# Patient Record
Sex: Male | Born: 1955
Health system: Southern US, Community
[De-identification: ages and names within clinical notes are randomized; demographics above are authoritative.]

## PROBLEM LIST (undated history)

## (undated) DIAGNOSIS — T8859XA Other complications of anesthesia, initial encounter: Secondary | ICD-10-CM

## (undated) DIAGNOSIS — M1990 Unspecified osteoarthritis, unspecified site: Secondary | ICD-10-CM

## (undated) DIAGNOSIS — G8929 Other chronic pain: Secondary | ICD-10-CM

## (undated) DIAGNOSIS — E7800 Pure hypercholesterolemia, unspecified: Secondary | ICD-10-CM

## (undated) DIAGNOSIS — D759 Disease of blood and blood-forming organs, unspecified: Secondary | ICD-10-CM

## (undated) DIAGNOSIS — C801 Malignant (primary) neoplasm, unspecified: Secondary | ICD-10-CM

## (undated) DIAGNOSIS — J45909 Unspecified asthma, uncomplicated: Secondary | ICD-10-CM

## (undated) DIAGNOSIS — I10 Essential (primary) hypertension: Secondary | ICD-10-CM

## (undated) HISTORY — PX: WRIST SURGERY: SHX841

## (undated) HISTORY — PX: BACK SURGERY: SHX140

## (undated) HISTORY — PX: OTHER SURGICAL HISTORY: SHX169

## (undated) HISTORY — PX: FOOT FRACTURE SURGERY: SHX645

## (undated) HISTORY — PX: FINGER SURGERY: SHX640

## (undated) HISTORY — PX: CHOLECYSTECTOMY: SHX55

---

## 1998-04-25 ENCOUNTER — Emergency Department (HOSPITAL_COMMUNITY): Admission: EM | Admit: 1998-04-25 | Discharge: 1998-04-25 | Payer: Self-pay | Admitting: *Deleted

## 1998-06-05 ENCOUNTER — Ambulatory Visit (HOSPITAL_COMMUNITY): Admission: RE | Admit: 1998-06-05 | Discharge: 1998-06-05 | Payer: Self-pay | Admitting: Family Medicine

## 1998-07-19 ENCOUNTER — Encounter: Payer: Self-pay | Admitting: Neurosurgery

## 1998-07-21 ENCOUNTER — Inpatient Hospital Stay (HOSPITAL_COMMUNITY): Admission: RE | Admit: 1998-07-21 | Discharge: 1998-07-23 | Payer: Self-pay | Admitting: Neurosurgery

## 1998-08-14 ENCOUNTER — Encounter: Admission: RE | Admit: 1998-08-14 | Discharge: 1998-11-12 | Payer: Self-pay | Admitting: Neurosurgery

## 1998-11-13 ENCOUNTER — Encounter: Admission: RE | Admit: 1998-11-13 | Discharge: 1999-02-11 | Payer: Self-pay

## 1999-02-12 ENCOUNTER — Encounter: Admission: RE | Admit: 1999-02-12 | Discharge: 1999-05-14 | Payer: Self-pay

## 1999-03-23 ENCOUNTER — Ambulatory Visit (HOSPITAL_COMMUNITY): Admission: RE | Admit: 1999-03-23 | Discharge: 1999-03-23 | Payer: Self-pay | Admitting: Neurosurgery

## 1999-03-23 ENCOUNTER — Encounter: Payer: Self-pay | Admitting: Neurosurgery

## 2000-05-05 ENCOUNTER — Observation Stay (HOSPITAL_COMMUNITY): Admission: EM | Admit: 2000-05-05 | Discharge: 2000-05-06 | Payer: Self-pay | Admitting: Emergency Medicine

## 2000-05-05 ENCOUNTER — Encounter: Payer: Self-pay | Admitting: Emergency Medicine

## 2000-05-06 ENCOUNTER — Encounter: Payer: Self-pay | Admitting: Cardiology

## 2000-06-04 ENCOUNTER — Encounter: Payer: Self-pay | Admitting: Family Medicine

## 2000-06-04 ENCOUNTER — Ambulatory Visit (HOSPITAL_COMMUNITY): Admission: RE | Admit: 2000-06-04 | Discharge: 2000-06-04 | Payer: Self-pay | Admitting: Family Medicine

## 2000-06-10 ENCOUNTER — Ambulatory Visit (HOSPITAL_COMMUNITY): Admission: RE | Admit: 2000-06-10 | Discharge: 2000-06-10 | Payer: Self-pay | Admitting: Family Medicine

## 2000-06-10 ENCOUNTER — Encounter: Payer: Self-pay | Admitting: Family Medicine

## 2000-07-23 ENCOUNTER — Ambulatory Visit (HOSPITAL_COMMUNITY): Admission: RE | Admit: 2000-07-23 | Discharge: 2000-07-23 | Payer: Self-pay | Admitting: Gastroenterology

## 2000-07-23 ENCOUNTER — Encounter (INDEPENDENT_AMBULATORY_CARE_PROVIDER_SITE_OTHER): Payer: Self-pay | Admitting: Specialist

## 2000-08-20 ENCOUNTER — Encounter (HOSPITAL_COMMUNITY): Admission: RE | Admit: 2000-08-20 | Discharge: 2000-11-18 | Payer: Self-pay | Admitting: Gastroenterology

## 2000-09-19 ENCOUNTER — Observation Stay (HOSPITAL_COMMUNITY): Admission: RE | Admit: 2000-09-19 | Discharge: 2000-09-20 | Payer: Self-pay | Admitting: General Surgery

## 2000-09-19 ENCOUNTER — Encounter (INDEPENDENT_AMBULATORY_CARE_PROVIDER_SITE_OTHER): Payer: Self-pay | Admitting: Specialist

## 2000-12-03 ENCOUNTER — Encounter (HOSPITAL_COMMUNITY): Admission: RE | Admit: 2000-12-03 | Discharge: 2001-03-03 | Payer: Self-pay | Admitting: Gastroenterology

## 2001-04-20 ENCOUNTER — Encounter (HOSPITAL_COMMUNITY): Admission: RE | Admit: 2001-04-20 | Discharge: 2001-06-17 | Payer: Self-pay | Admitting: Gastroenterology

## 2001-06-19 ENCOUNTER — Encounter (HOSPITAL_COMMUNITY): Admission: RE | Admit: 2001-06-19 | Discharge: 2001-09-17 | Payer: Self-pay | Admitting: Gastroenterology

## 2002-02-04 ENCOUNTER — Encounter (HOSPITAL_COMMUNITY): Admission: RE | Admit: 2002-02-04 | Discharge: 2002-05-05 | Payer: Self-pay | Admitting: Gastroenterology

## 2002-05-07 ENCOUNTER — Encounter (HOSPITAL_COMMUNITY): Admission: RE | Admit: 2002-05-07 | Discharge: 2002-08-05 | Payer: Self-pay | Admitting: Gastroenterology

## 2002-07-05 ENCOUNTER — Encounter: Payer: Self-pay | Admitting: Family Medicine

## 2002-07-05 ENCOUNTER — Ambulatory Visit (HOSPITAL_COMMUNITY): Admission: RE | Admit: 2002-07-05 | Discharge: 2002-07-05 | Payer: Self-pay | Admitting: Family Medicine

## 2002-10-22 ENCOUNTER — Encounter (HOSPITAL_COMMUNITY): Admission: RE | Admit: 2002-10-22 | Discharge: 2003-01-20 | Payer: Self-pay | Admitting: Gastroenterology

## 2003-02-03 ENCOUNTER — Encounter (HOSPITAL_COMMUNITY): Admission: RE | Admit: 2003-02-03 | Discharge: 2003-05-04 | Payer: Self-pay | Admitting: Gastroenterology

## 2003-07-28 ENCOUNTER — Encounter (HOSPITAL_COMMUNITY): Admission: RE | Admit: 2003-07-28 | Discharge: 2003-10-26 | Payer: Self-pay | Admitting: Gastroenterology

## 2003-08-02 ENCOUNTER — Ambulatory Visit (HOSPITAL_COMMUNITY): Admission: RE | Admit: 2003-08-02 | Discharge: 2003-08-02 | Payer: Self-pay | Admitting: Family Medicine

## 2003-08-02 ENCOUNTER — Encounter: Payer: Self-pay | Admitting: Family Medicine

## 2003-12-07 ENCOUNTER — Ambulatory Visit (HOSPITAL_COMMUNITY): Admission: RE | Admit: 2003-12-07 | Discharge: 2003-12-07 | Payer: Self-pay | Admitting: Gastroenterology

## 2004-01-11 ENCOUNTER — Encounter (HOSPITAL_COMMUNITY): Admission: RE | Admit: 2004-01-11 | Discharge: 2004-04-10 | Payer: Self-pay | Admitting: Gastroenterology

## 2004-01-23 ENCOUNTER — Ambulatory Visit (HOSPITAL_COMMUNITY): Admission: RE | Admit: 2004-01-23 | Discharge: 2004-01-23 | Payer: Self-pay | Admitting: Family Medicine

## 2004-03-08 ENCOUNTER — Ambulatory Visit (HOSPITAL_BASED_OUTPATIENT_CLINIC_OR_DEPARTMENT_OTHER): Admission: RE | Admit: 2004-03-08 | Discharge: 2004-03-08 | Payer: Self-pay | Admitting: Orthopedic Surgery

## 2004-03-08 ENCOUNTER — Ambulatory Visit (HOSPITAL_COMMUNITY): Admission: RE | Admit: 2004-03-08 | Discharge: 2004-03-08 | Payer: Self-pay | Admitting: Orthopedic Surgery

## 2004-04-04 ENCOUNTER — Encounter (HOSPITAL_COMMUNITY): Admission: RE | Admit: 2004-04-04 | Discharge: 2004-06-25 | Payer: Self-pay | Admitting: *Deleted

## 2004-09-21 ENCOUNTER — Encounter (HOSPITAL_COMMUNITY): Admission: RE | Admit: 2004-09-21 | Discharge: 2004-12-20 | Payer: Self-pay | Admitting: Gastroenterology

## 2004-10-30 ENCOUNTER — Encounter: Admission: RE | Admit: 2004-10-30 | Discharge: 2004-10-30 | Payer: Self-pay | Admitting: Family Medicine

## 2004-11-18 HISTORY — PX: HEMORROIDECTOMY: SUR656

## 2004-12-07 ENCOUNTER — Emergency Department (HOSPITAL_COMMUNITY): Admission: EM | Admit: 2004-12-07 | Discharge: 2004-12-07 | Payer: Self-pay | Admitting: Emergency Medicine

## 2005-01-03 ENCOUNTER — Emergency Department (HOSPITAL_COMMUNITY): Admission: EM | Admit: 2005-01-03 | Discharge: 2005-01-03 | Payer: Self-pay | Admitting: Emergency Medicine

## 2005-01-28 ENCOUNTER — Ambulatory Visit (HOSPITAL_COMMUNITY): Admission: RE | Admit: 2005-01-28 | Discharge: 2005-01-28 | Payer: Self-pay | Admitting: Urology

## 2005-04-08 ENCOUNTER — Encounter: Admission: RE | Admit: 2005-04-08 | Discharge: 2005-04-08 | Payer: Self-pay | Admitting: Gastroenterology

## 2005-09-30 ENCOUNTER — Encounter: Admission: RE | Admit: 2005-09-30 | Discharge: 2005-09-30 | Payer: Self-pay | Admitting: Gastroenterology

## 2007-02-21 ENCOUNTER — Encounter: Admission: RE | Admit: 2007-02-21 | Discharge: 2007-02-21 | Payer: Self-pay | Admitting: Gastroenterology

## 2008-03-29 ENCOUNTER — Encounter (HOSPITAL_COMMUNITY): Admission: RE | Admit: 2008-03-29 | Discharge: 2008-06-27 | Payer: Self-pay | Admitting: Gastroenterology

## 2008-07-06 ENCOUNTER — Encounter (HOSPITAL_COMMUNITY): Admission: RE | Admit: 2008-07-06 | Discharge: 2008-08-11 | Payer: Self-pay | Admitting: Gastroenterology

## 2008-07-11 ENCOUNTER — Ambulatory Visit (HOSPITAL_COMMUNITY): Admission: RE | Admit: 2008-07-11 | Discharge: 2008-07-11 | Payer: Self-pay | Admitting: Family Medicine

## 2008-07-13 ENCOUNTER — Ambulatory Visit (HOSPITAL_COMMUNITY): Admission: RE | Admit: 2008-07-13 | Discharge: 2008-07-13 | Payer: Self-pay | Admitting: Family Medicine

## 2008-07-14 ENCOUNTER — Ambulatory Visit (HOSPITAL_COMMUNITY): Admission: RE | Admit: 2008-07-14 | Discharge: 2008-07-14 | Payer: Self-pay | Admitting: Family Medicine

## 2008-07-14 ENCOUNTER — Ambulatory Visit: Payer: Self-pay | Admitting: Vascular Surgery

## 2008-07-14 ENCOUNTER — Encounter (INDEPENDENT_AMBULATORY_CARE_PROVIDER_SITE_OTHER): Payer: Self-pay | Admitting: Family Medicine

## 2009-02-22 ENCOUNTER — Encounter (HOSPITAL_COMMUNITY): Admission: RE | Admit: 2009-02-22 | Discharge: 2009-05-23 | Payer: Self-pay | Admitting: Gastroenterology

## 2010-03-24 ENCOUNTER — Inpatient Hospital Stay (HOSPITAL_COMMUNITY)
Admission: EM | Admit: 2010-03-24 | Discharge: 2010-03-26 | Payer: Self-pay | Source: Home / Self Care | Admitting: Emergency Medicine

## 2011-02-05 LAB — DIFFERENTIAL
Basophils Absolute: 0.1 10*3/uL (ref 0.0–0.1)
Basophils Relative: 1 % (ref 0–1)
Eosinophils Absolute: 0 10*3/uL (ref 0.0–0.7)
Eosinophils Relative: 0 % (ref 0–5)
Lymphocytes Relative: 9 % — ABNORMAL LOW (ref 12–46)
Lymphs Abs: 1.1 10*3/uL (ref 0.7–4.0)
Monocytes Absolute: 0.5 10*3/uL (ref 0.1–1.0)
Monocytes Relative: 4 % (ref 3–12)
Neutro Abs: 10.5 10*3/uL — ABNORMAL HIGH (ref 1.7–7.7)
Neutrophils Relative %: 86 % — ABNORMAL HIGH (ref 43–77)

## 2011-02-05 LAB — CBC
HCT: 43.2 % (ref 39.0–52.0)
Hemoglobin: 14.8 g/dL (ref 13.0–17.0)
MCHC: 34.3 g/dL (ref 30.0–36.0)
MCV: 88.5 fL (ref 78.0–100.0)
Platelets: 212 10*3/uL (ref 150–400)
RBC: 4.88 MIL/uL (ref 4.22–5.81)
RDW: 14.6 % (ref 11.5–15.5)
WBC: 12.3 10*3/uL — ABNORMAL HIGH (ref 4.0–10.5)

## 2011-02-05 LAB — BASIC METABOLIC PANEL
BUN: 11 mg/dL (ref 6–23)
CO2: 25 mEq/L (ref 19–32)
Calcium: 9.4 mg/dL (ref 8.4–10.5)
Chloride: 104 mEq/L (ref 96–112)
Creatinine, Ser: 1.05 mg/dL (ref 0.4–1.5)
GFR calc Af Amer: >60 mL/min (ref >60)
GFR calc non Af Amer: >60 mL/min (ref >60)
Glucose, Bld: 100 mg/dL — ABNORMAL HIGH (ref 70–99)
Potassium: 4 mEq/L (ref 3.5–5.1)
Sodium: 137 mEq/L (ref 135–145)

## 2011-02-27 LAB — POCT HEMOGLOBIN-HEMACUE: Hemoglobin: 15.6 g/dL (ref 13.0–17.0)

## 2011-04-05 NOTE — Procedures (Signed)
[facility]  Patient:    Stuart Gaines, Stuart Gaines                     MRN: [id number] Proc. Date: 07/23/00 Adm. Date:  [id number] Attending:  Louie Bun CC:         Elana Alm Eliezer Lofts., M.D.  Lorne Skeens. Hoxworth, M.D.   Procedure Report  PROCEDURE:  Colonoscopy.  SURGEON:  John C. Madilyn Fireman, M.D.  INDICATIONS FOR PROCEDURE:  Intermittent rectal bleeding in a 55 year old patient who is scheduled for upcoming cholecystectomy and surgery for asymptomatic internal hemorrhoid.  DESCRIPTION OF PROCEDURE:  The patient was placed in the left lateral decubitus position and placed on the pulse monitor with continuous low flow oxygen delivered by nasal cannula.  He was sedated with 75 mg of IV Demerol and 10 mg of IV Versed.  The Olympus video colonoscope was inserted into the rectum and advanced to the cecum, confirmed by transillumination at McBurneys point and visualization of the ileocecal valve and appendiceal orifice.  The prep was good.  Within the base of the cecum, there was seen a diminutive 5 mm polyp which was fulgurated by hot biopsy.  The remainder of the cecum, ascending, transverse, and descending colon appeared normal.  No further polyps, masses, diverticuli, or other mucosal abnormalities.  Within the sigmoid colon was seen an 8 mm sessile polyp which was fulgurated by hot biopsy and the remainder of the sigmoid colon was normal.  The proximal rectum appeared normal.  Retroflexed view of the anus did reveal somewhat enlarged internal hemorrhoids.  The colonoscope was then withdrawn and the patient returned to the recovery room in stable condition.  He tolerated the procedure well and there were no immediate complications.  IMPRESSION: 1. Enlarged internal hemorrhoid. 2. Two small colon polyps.  PLAN:  Await histology for determination of method and interval for future colon screening.  Followup with Dr. Johna Sheriff for cholecystectomy and  possible hemorrhoidectomy. DD:  07/23/00 TD:  07/24/00 Job: [id number]

## 2011-04-05 NOTE — Op Note (Signed)
NAME:  Stuart Gaines, Stuart Gaines                        ACCOUNT NO.:  [id number]   MEDICAL RECORD NO.:  [id number]                   PATIENT TYPE:  AMB   LOCATION:  ENDO                                 FACILITY:  [facility]   PHYSICIAN:  John C. Madilyn Fireman, M.D.                 DATE OF BIRTH:  [birth year]   DATE OF PROCEDURE:  12/06/2002  DATE OF DISCHARGE:                                 OPERATIVE REPORT   PROCEDURE:  Colonoscopy.   INDICATIONS FOR PROCEDURE:  History of adenomatous colon polyps 3 years ago.   DESCRIPTION OF PROCEDURE:  The patient was placed in the left lateral  decubitus position and placed on the pulse monitor with continuous low-flow  oxygen delivered by nasal cannula.  He was sedated with 100 mcg IV fentanyl  and 10 mg IV Versed.  The Olympus video colonoscope was inserted into the  rectum and advanced to the cecum, confirmed by transillumination at  McBurney's point and visualization of the ileocecal valve and appendiceal  orifice.  The prep was excellent.  The cecum, ascending, transverse, and  descending colon all appeared normal with no masses, polyps, diverticula, or  other mucosal abnormalities.  Within the sigmoid colon, there were seen a  few scattered diverticula and no other abnormalities.  The rectum appeared  normal, and retroflexed view of the anus revealed no obvious internal  hemorrhoids.  The scope was then withdrawn, and the patient returned to the  recovery room in stable condition.  He tolerated the procedure well, and  there were no immediate complications.   IMPRESSION:  1. Left-sided diverticulosis.  2. Otherwise, normal study.   PLAN:  Repeat colonoscopy in 5 years.                                               John C. Madilyn Fireman, M.D.    JCH/MEDQ  D:  12/07/2003  T:  12/07/2003  Job:  [id number]   cc:   Molly Maduro A. Nicholos Johns, M.D.  [address]  [place]  [place] [address]  Fax: [phone]

## 2011-04-05 NOTE — Op Note (Signed)
NAME:  Stuart, Gaines                        ACCOUNT NO.:  [id number]   MEDICAL RECORD NO.:  [id number]                   PATIENT TYPE:  AMB   LOCATION:  DSC                                  FACILITY:  [facility]   PHYSICIAN:  Nadara Mustard, M.D.                DATE OF BIRTH:  Mar 24, 1956   DATE OF PROCEDURE:  03/08/2004  DATE OF DISCHARGE:                                 OPERATIVE REPORT   PREOPERATIVE DIAGNOSIS:  Medial meniscal tear, left knee.   POSTOPERATIVE DIAGNOSIS:  1. Medial meniscal tear, left knee.  2. Osteochondral defect medial femoral condyle, left knee.  3. Foreign body, left knee, pencil lead, subcutaneous.   PROCEDURE:  1. Left knee arthroscopy with abrasion chondroplasty medial femoral condyle.  2. Partial medial meniscectomy.  3. Removal of pencil lead, subcutaneous, left knee.   SURGEON:  Nadara Mustard, M.D.   ANESTHESIA:  LMA.   ESTIMATED BLOOD LOSS:  Minimal.   ANTIBIOTICS:  None.   DRAINS:  None.   COMPLICATIONS:  None.   INJECTION:  20 mL of 0.5% Marcaine with 4 mg morphine.   DISPOSITION:  To the PACU in stable condition.   INDICATIONS FOR PROCEDURE:  The patient is a 55 year old gentleman with  mechanical symptoms in his left knee.  The patient has failed conservative  care and presents at this time for arthroscopic intervention.  MRI scan  confirms the meniscal pathology.  The risks and benefits were discussed  including infection, neurovascular injury, persistent pain, need for  additional surgery.  The patient states he understands and wishes to proceed  at this time.   DESCRIPTION OF PROCEDURE:  The patient was brought to OR room 5 and  underwent a general LMA anesthetic.  After an adequate level of anesthesia  was obtained, the patient's left lower extremity was prepped using DuraPrep  and draped in a sterile field.  The scope was inserted through the  inferolateral portal and a working portal was established inferomedial.  There was a  significant amount of synovitis anteriorly and this was removed.  The patient had a degenerative tear of the medial meniscus and this was  debrided with a shaver.  The patient had a large osteochondral defect of the  medial femoral condyle and this was debrided back to bleeding, viable  subchondral bone and the loose edges were debrided with a ring curet.  Examination of the notch showed an intact ACL.  Visualization of the lateral  joint line in the figure-of-four position showed na intact lateral meniscus  and intact lateral cartilage.  Both the remaining medial meniscus and entire  lateral meniscus were probed and the meniscus were stable.  Visualization of  the patellofemoral joint showed there to be no loose bodies.  There was a  small amount of degenerative changes.  Visualization of the medial and  lateral joint line and re-exanimation of the compartment showed there to  be  no further loose bodies.  The instruments were removed. The portals were  closed using 4-0 nylon.  The joint was injected with a total of 20 mL of  0.5% Marcaine plain and 4 mg morphine.  Attention was then focused to an  area just inferior to the lateral portal where the patient had a chronic  piece of lead stuck under the skin.  A horizontal incision was made directly  over the area of the pencil lead.  The area of skin and soft tissue were  ellipsed out at the area of the pencil lead foreign body.  This was then  removed in one block of tissue.  The wound was then evaluated and cleansed.  There was no evidence of any further foreign bodies.  This was then sutured  closed with 4-0 nylon.  The wounds were covered with Adaptic, orthopedic  sponges, sterile Webril, and a Coban dressing.  The patient was extubated  and taken to the PACU in stable condition.  Plan to follow up in two weeks,  weight-bearing as tolerated, change the dressing in three days.                                               Nadara Mustard,  M.D.    MVD/MEDQ  D:  03/08/2004  T:  03/09/2004  Job:  [id number]

## 2011-04-05 NOTE — Op Note (Signed)
[facility]  Patient:    Stuart Gaines, Stuart Gaines                     MRN: [id number] Proc. Date: 09/19/00 Adm. Date:  [id number] Attending:  Glenna Fellows Tappan                           Operative Report  PREOPERATIVE DIAGNOSES: 1. Cholelithiasis. 2. Hemorrhoids with anal fissure.  PROCEDURES: 1. Laparoscopic cholecystectomy. 2. Hemorrhoidectomy with banding of internal hemorrhoids and internal    anal sphincterotomy.  SURGEON:  Lorne Skeens. Hoxworth, M.D.  ANESTHESIA:  General.  BRIEF HISTORY:  Stuart Gaines is a 55 year old white male who presents with repeated episodes of right upper quadrant abdominal pain.  He also has longstanding bleeding, irritation and pain from hemorrhoids.  He has had an extensive workup, including gallbladder ultrasound showing stones, a normal GI series except for small hiatal hernia.  He has not responded to proton pump inhibitors.  He has had a full colonoscopy which was negative except for hemorrhoids.  Examination has revealed right upper quadrant tenderness, and in addition he is difficult to examine in the office due to discomfort.  Has an apparent posterior fissure and also hemorrhoids.  We have elected to proceed with laparoscopic cholecystectomy followed by hemorrhoidectomy and internal anal sphincterotomy.  The nature of the procedures, indications and risks of bleeding, infection and other complications were discussed and understood.  He is now brought to the operating room for these procedures.  DESCRIPTION OF PROCEDURE:  The patient is brought to the operating room and placed in supine position on the operating table, and general endotracheal anesthesia was induced.  He had received broad spectrum antibiotics preoperatively.  The abdomen was sterilely prepped and draped.  Local anesthesia was used to infiltrate trocar sites prior to the incisions.  A 1 cm incision was made in the umbilicus and then  carried down to the midline fascia.  It was sharply incised for 1 cm, the peritoneum entered under direct vision.  Through a mattress suture of 0 Vicryl, the Hasson trocar was placed and pneumoperitoneum established.  Under direct vision a 10 mm trocar was placed in the psoas ______ area, and two 5 mm trocars were placed in the right upper quadrant.  The right anterior abdominal wall was carefully examined as the patient felt that he had symptoms for hernia in this area; but there was no evidence of any defect in the abdominal wall.  There were some omental adhesions of the gallbladder that appeared somewhat chronically thickened.  The fundus was grasped, and elevated up over the liver.  Omental adhesions were taken down with cautery.  The infundibulum was exposed and retracted inferolaterally.  There was a lot of fatty tissue around the neck of the gallbladder, which was stripped down toward the port of hepatis.  Anterior branch of the cystic artery was divided between clips.  The distal gallbladder was thoroughly dissected and the cystic duct gallbladder junction identified. This was dissected 360 degrees and the cystic duct dissected over about 1 cm. The anatomy was cleared and the cystic duct was totally clipped proximally, clipped distally and divided.  The gallbladder was dissected free from its bed using hook and spatula cautery.  The posterior branch of cystic artery was controlled with clips.  The gallbladder was detached from the liver, brought out through the umbilicus.  Hemostasis was obtained in the gallbladder bed with  cautery.  The right upper quadrant was thoroughly irrigated and suctioned; good hemostasis was assured.  Trocars were removed under direct vision.  All CO2 was evacuated from the peritoneal cavity.  The pursestring suture was secured at the umbilicus.  The skin incisions were closed with interrupted subcuticular 4-0 Monocryl and Steri-Strips.  Dry sterile  dressing was applied.  Sponge, needle and instrument counts were correct.  Following this the patient was carefully positioned in the lithotomy position, and the perineum prepped and draped.  The anus was carefully dilated and a Seneca retractor placed and the anal canal carefully inspected.  There was a large, deep posterior midline fissure and associated skin tag.  In the left lateral position was a large group of combination internal and external hemorrhoids.  There were fairly extensive internal hemorrhoids in the right anterior and right ______ posterior locations, but minimal external hemorrhoids in this location.  I elected to perform a formal hemorrhoidectomy in the left lateral combination hemorrhoid area, with anal sphincterotomy and band the remainder of the internal hemorrhoids.  The hemorrhoid group in the left lateral area was grasped with Pennington clamp, and a 3-0 chromic suture placed in its apex.  The hemorrhoid group was then elliptically excised out onto the anoderm.  The hemorrhoid group was carefully dissected up off the sphincter muscles, which were identified and protected.  The hemorrhoid group was removed.  Hemostasis was obtained with electrocautery and with the chromic suture which was used to close the incision in a running locking fashion. Following this, in the right lateral position a small stab wound was made outside the dentate line, and a Hemostat inserted into the intersphincteric groove (which was easily palpable), and the hypertrophied internal anal sphincter brought out into the small incision and divided with cautery. Hemostasis was obtained with cautery and pressure.  Following this, four good-sized internal hemorrhoids in the anterior, right anterior and right posterior areas were exposed, grasped and rubber-banded.  The anus was inspected, hemostasis was complete.  The perianal areas were injected with 0.25% Marcaine with epinephrine.  Dressing  of Xylocaine jelly 4 x 4s were applied.  The patient was taken to recovery in good condition. DD:  09/19/00 TD:  09/20/00 Job: [id number]

## 2011-08-14 LAB — POCT I-STAT 4, (NA,K, GLUC, HGB,HCT)
Glucose, Bld: 83
HCT: 45
Hemoglobin: 15.3
Operator id: 238621
Potassium: 4.6
Sodium: 140

## 2013-04-29 ENCOUNTER — Other Ambulatory Visit: Payer: Self-pay | Admitting: Neurological Surgery

## 2013-04-29 DIAGNOSIS — M549 Dorsalgia, unspecified: Secondary | ICD-10-CM

## 2013-05-10 ENCOUNTER — Ambulatory Visit
Admission: RE | Admit: 2013-05-10 | Discharge: 2013-05-10 | Disposition: A | Discharge status: Home / Self Care | Payer: 59 | Source: Ambulatory Visit | Attending: Neurological Surgery | Admitting: Neurological Surgery

## 2013-05-10 DIAGNOSIS — M549 Dorsalgia, unspecified: Secondary | ICD-10-CM

## 2013-05-10 MED ORDER — GADOBENATE DIMEGLUMINE 529 MG/ML IV SOLN
20.0000 mL | Freq: Once | INTRAVENOUS | Status: AC | PRN
  Administered 2013-05-10: 20 mL via INTRAVENOUS

## 2013-11-18 HISTORY — PX: FRACTURE SURGERY: SHX138

## 2014-04-19 ENCOUNTER — Encounter (HOSPITAL_COMMUNITY): Payer: Self-pay | Admitting: Emergency Medicine

## 2014-04-19 ENCOUNTER — Emergency Department (HOSPITAL_COMMUNITY): Payer: 59

## 2014-04-19 ENCOUNTER — Emergency Department (HOSPITAL_COMMUNITY)
Admission: EM | Admit: 2014-04-19 | Discharge: 2014-04-19 | Disposition: A | Discharge status: Home / Self Care | Payer: 59 | Attending: Emergency Medicine | Admitting: Emergency Medicine

## 2014-04-19 DIAGNOSIS — I10 Essential (primary) hypertension: Secondary | ICD-10-CM | POA: Insufficient documentation

## 2014-04-19 DIAGNOSIS — L02818 Cutaneous abscess of other sites: Secondary | ICD-10-CM | POA: Insufficient documentation

## 2014-04-19 DIAGNOSIS — S0100XA Unspecified open wound of scalp, initial encounter: Secondary | ICD-10-CM | POA: Insufficient documentation

## 2014-04-19 DIAGNOSIS — E7800 Pure hypercholesterolemia, unspecified: Secondary | ICD-10-CM | POA: Insufficient documentation

## 2014-04-19 DIAGNOSIS — Y929 Unspecified place or not applicable: Secondary | ICD-10-CM | POA: Insufficient documentation

## 2014-04-19 DIAGNOSIS — Y9389 Activity, other specified: Secondary | ICD-10-CM | POA: Insufficient documentation

## 2014-04-19 DIAGNOSIS — L03818 Cellulitis of other sites: Secondary | ICD-10-CM | POA: Insufficient documentation

## 2014-04-19 DIAGNOSIS — IMO0002 Reserved for concepts with insufficient information to code with codable children: Secondary | ICD-10-CM | POA: Insufficient documentation

## 2014-04-19 DIAGNOSIS — L03811 Cellulitis of head [any part, except face]: Secondary | ICD-10-CM

## 2014-04-19 DIAGNOSIS — Z79899 Other long term (current) drug therapy: Secondary | ICD-10-CM | POA: Insufficient documentation

## 2014-04-19 HISTORY — DX: Essential (primary) hypertension: I10

## 2014-04-19 HISTORY — DX: Pure hypercholesterolemia, unspecified: E78.00

## 2014-04-19 MED ORDER — IBUPROFEN 800 MG PO TABS: 800.0000 mg | ORAL_TABLET | Freq: Three times a day (TID) | ORAL | Status: DC | PRN

## 2014-04-19 MED ORDER — CEPHALEXIN 500 MG PO CAPS: 1000.0000 mg | ORAL_CAPSULE | Freq: Two times a day (BID) | ORAL | Status: DC

## 2014-04-19 NOTE — ED Notes (Signed)
Per pt sts about 2 weeks ago he hit head on metal beam under a trailer. sts that since he has been having head pain and some swelling. sts think infection. sts he feels something moving around right eye.

## 2014-04-19 NOTE — ED Provider Notes (Signed)
CSN: [id number]     Arrival date & time 04/19/14  0905 History   First MD Initiated Contact with Patient 04/19/14 [phone]     Chief Complaint  Patient presents with  . Head Injury     (Consider location/radiation/quality/duration/timing/severity/associated sxs/prior Treatment) HPI Patient presents to the emergency department with headache and scalp.  Pain, following a head injury that occurred 2 weeks ago.  The patient, states, on his head underneath a trailer, that he was working on he states, that he didn't 2-3 times patient's main concern is that he had an infection of the scalp and is there any parasites could be under his skin.  Patient, states, that he has had no nausea, vomiting weakness blurred vision, chest pain, shortness of breath, fever, dizziness, numbness, rash or syncope.  Patient, states, that he's had persistent, headache since that time and the initial injury.  He should states, that he did not sustain.  No significant blows to the head, he hit his head several times.  Patient does have 2 small punctures in the top of the scalp he should states she feels a twitching around his right eye and forehead Past Medical History  Diagnosis Date  . Hypertension   . High cholesterol    Past Surgical History  Procedure Laterality Date  . Back surgery    . Cholecystectomy     History reviewed. No pertinent family history. History  Substance Use Topics  . Smoking status: Never Smoker   . Smokeless tobacco: Not on file  . Alcohol Use: No    Review of Systems  All other systems negative except as documented in the HPI. All pertinent positives and negatives as reviewed in the HPI.  Allergies  Review of patient's allergies indicates no known allergies.  Home Medications   Prior to Admission medications   Medication Sig Start Date End Date Taking? Authorizing Provider  lisinopril (PRINIVIL,ZESTRIL) 20 MG tablet Take 20 mg by mouth daily.   Yes Historical Provider, MD  omeprazole  (PRILOSEC) 20 MG capsule Take 20 mg by mouth daily.   Yes Historical Provider, MD  atorvastatin (LIPITOR) 80 MG tablet Take 40 mg by mouth daily. 03/24/14   Historical Provider, MD  diclofenac (VOLTAREN) 75 MG EC tablet Take 75 mg by mouth daily. 03/28/14   Historical Provider, MD  HYDROcodone-acetaminophen (NORCO) 7.5-325 MG per tablet Take 1 tablet by mouth 2 (two) times daily as needed. 03/26/14   Historical Provider, MD   BP 114/75  Pulse 91  Temp(Src) 97.4 F (36.3 C) (Oral)  Resp 14  Wt 225 lb (102.059 kg)  SpO2 96% Physical Exam  Nursing note and vitals reviewed. Constitutional: He is oriented to person, place, and time. He appears well-developed and well-nourished. No distress.  HENT:  Head: Normocephalic.    Mouth/Throat: Oropharynx is clear and moist.  Eyes: Pupils are equal, round, and reactive to light.  Neck: Normal range of motion. Neck supple.  Cardiovascular: Normal rate, regular rhythm and normal heart sounds.  Exam reveals no gallop and no friction rub.   No murmur heard. Pulmonary/Chest: Effort normal and breath sounds normal. No respiratory distress.  Neurological: He is alert and oriented to person, place, and time.  Skin: Skin is warm and dry. No rash noted. No erythema.    ED Course  Procedures (including critical care time) Labs Review Labs Reviewed - No data to display  Imaging Review Ct Head Wo Contrast  04/19/2014   CLINICAL DATA:  Recent traumatic injury with  headaches  EXAM: CT HEAD WITHOUT CONTRAST  TECHNIQUE: Contiguous axial images were obtained from the base of the skull through the vertex without intravenous contrast.  COMPARISON:  None.  FINDINGS: The bony calvarium is intact. The ventricles are of normal size and configuration. No findings to suggest acute hemorrhage, acute infarction or space-occupying mass lesion are noted.  IMPRESSION: No acute abnormality noted.   Electronically Signed   By: Inez Catalina M.D.   On: 04/19/2014 10:09    Patient  be treated for possible cellulitis of the scalp.  Told to return here as needed.  Advised to followup with his primary care Dr.. told to keep area clean and dry  Brent General, PA-C 04/19/14 1038

## 2014-04-19 NOTE — ED Notes (Signed)
States Sunday afternoon started having his right eyebrow twitching. States this morning it from his nose up to forehead. Does have small spot of swelling to the top of his head in bald spot.

## 2014-04-19 NOTE — Discharge Instructions (Signed)
Keep the area clean and dry.  Followup with her primary care Dr. return here for any worsening in your condition.

## 2014-04-19 NOTE — ED Notes (Signed)
Gerald Stabs, PA at the bedside.

## 2014-04-19 NOTE — ED Notes (Signed)
Pt brought back to room; pt getting undressed and into a gown at this time; Angie Fava, RN aware

## 2014-04-19 NOTE — ED Provider Notes (Signed)
Medical screening examination/treatment/procedure(s) were performed by non-physician practitioner and as supervising physician I was immediately available for consultation/collaboration.   EKG Interpretation None       Orlie Dakin, MD 04/19/14 [id number]

## 2015-05-19 ENCOUNTER — Ambulatory Visit (INDEPENDENT_AMBULATORY_CARE_PROVIDER_SITE_OTHER): Payer: 59

## 2015-05-19 ENCOUNTER — Encounter: Payer: Self-pay | Admitting: Podiatry

## 2015-05-19 ENCOUNTER — Ambulatory Visit (INDEPENDENT_AMBULATORY_CARE_PROVIDER_SITE_OTHER): Payer: 59 | Admitting: Podiatry

## 2015-05-19 VITALS — BP 118/67 | HR 91 | Resp 15

## 2015-05-19 DIAGNOSIS — G5762 Lesion of plantar nerve, left lower limb: Secondary | ICD-10-CM

## 2015-05-19 DIAGNOSIS — M79672 Pain in left foot: Secondary | ICD-10-CM | POA: Diagnosis not present

## 2015-05-19 DIAGNOSIS — G5782 Other specified mononeuropathies of left lower limb: Secondary | ICD-10-CM

## 2015-05-19 NOTE — Progress Notes (Signed)
   Subjective:    Patient ID: Stuart Gaines, male    DOB: 02/21/1956, 59 y.o.   MRN: [id number]  HPI  Pt presents with left foot pain/popping sensation on the plantar region between 4th and 5th toe, radiates dorsally, lasting 8 months now  Review of Systems  Musculoskeletal: Positive for myalgias.  All other systems reviewed and are negative.      Objective:   Physical Exam        Assessment & Plan:

## 2015-05-21 NOTE — Progress Notes (Signed)
Subjective:     Patient ID: Stuart Gaines, male   DOB: 1956/06/03, 59 y.o.   MRN: [id number]  HPI patient presents stating he's been having some pain in his forefoot for the last 8 months and he does not remember specific injury. Feels like a popping sensation and the pain has gradually gotten worse over that time   Review of Systems  All other systems reviewed and are negative.      Objective:   Physical Exam  Constitutional: He is oriented to person, place, and time.  Cardiovascular: Intact distal pulses.   Musculoskeletal: Normal range of motion.  Neurological: He is oriented to person, place, and time.  Skin: Skin is warm.  Nursing note and vitals reviewed.  neurovascular status intact with muscle strength adequate and range of motion subtalar midtarsal joint within normal limits. I noted mild swelling in the midfoot region left but it does not appear to be tender and he does not remember specific injury and this area and there didn't seem to be quite a bit of discomfort when I palpated the third interspace left especially when pressing the foot together and pressing in to the interspace itself area patient has good digital perfusion and is well oriented 3     Assessment:     Possibility for neuroma with a Lisfranc's injury noted on x-ray which could be contributory to his problem    Plan:     H&P and x-rays reviewed and today I went ahead and treated as a pinched nerve utilizing a neuro lysis injection consisting of purified D hydrated alcohol solution along with Marcaine and we'll reevaluate again in 2 weeks

## 2015-06-02 ENCOUNTER — Ambulatory Visit (INDEPENDENT_AMBULATORY_CARE_PROVIDER_SITE_OTHER): Payer: 59 | Admitting: Podiatry

## 2015-06-02 ENCOUNTER — Encounter: Payer: Self-pay | Admitting: Podiatry

## 2015-06-02 VITALS — BP 117/73 | HR 93 | Resp 15

## 2015-06-02 DIAGNOSIS — G5782 Other specified mononeuropathies of left lower limb: Secondary | ICD-10-CM

## 2015-06-02 DIAGNOSIS — M779 Enthesopathy, unspecified: Secondary | ICD-10-CM

## 2015-06-02 DIAGNOSIS — G5762 Lesion of plantar nerve, left lower limb: Secondary | ICD-10-CM

## 2015-06-02 MED ORDER — TRIAMCINOLONE ACETONIDE 10 MG/ML IJ SUSP
10.0000 mg | Freq: Once | INTRAMUSCULAR | Status: AC
  Administered 2015-06-02: 10 mg

## 2015-06-02 NOTE — Progress Notes (Signed)
Subjective:     Patient ID: Stuart Gaines, male   DOB: 1956/06/14, 59 y.o.   MRN: [id number]  HPI patient states it seems to reduce my pain for about 4 hours but then I had complete reoccurrence and I still feel a popping sensation after being on my foot for around an hour and then pain which occurs subsequent   Review of Systems     Objective:   Physical Exam Neurovascular status intact with patient having discomfort in the fourth metatarsophalangeal joint with what feels like a prominent metatarsal along with moderate discomfort in the third intermetatarsal space with no apparent neuroma-like sensation when I pressed into the interspace    Assessment:     Very difficult to determine between an inflammatory capsulitis with plantarflexed metatarsal versus possible neuroma symptomatology    Plan:     H&P and condition reviewed and at this time I did a proximal nerve block aspirated the fourth MPJ getting out of a small amount of clear fluid and injected with a quarter cc of dexamethasone Kenalog and applied thick plantar pad. We may make this patient orthotics or ultimately this patient may require surgery for MRI

## 2015-06-23 ENCOUNTER — Ambulatory Visit (INDEPENDENT_AMBULATORY_CARE_PROVIDER_SITE_OTHER): Payer: 59 | Admitting: Podiatry

## 2015-06-23 ENCOUNTER — Encounter: Payer: Self-pay | Admitting: Podiatry

## 2015-06-23 VITALS — BP 119/70 | HR 71 | Resp 15

## 2015-06-23 DIAGNOSIS — Q667 Congenital pes cavus: Secondary | ICD-10-CM | POA: Diagnosis not present

## 2015-06-23 DIAGNOSIS — M779 Enthesopathy, unspecified: Secondary | ICD-10-CM

## 2015-06-23 DIAGNOSIS — M216X9 Other acquired deformities of unspecified foot: Secondary | ICD-10-CM

## 2015-06-24 NOTE — Progress Notes (Signed)
Subjective:     Patient ID: Stuart Gaines, male   DOB: Dec 23, 1955, 59 y.o.   MRN: [id number]  HPI patient states I can still feel the bones crunching but it seems that I got a lot of relief when you did that injection into my joint   Review of Systems     Objective:   Physical Exam Neurovascular status intact muscle strength adequate with pain in the fourth MPJ left that has improved but it is still present when palpated with quite a bit of prominence of the fourth metatarsal itself and lifting of the toe    Assessment:     Probable inflammatory condition fourth MPJ left with possibility still for moderate neuroma-like symptomatology    Plan:     H&P and x-rays reviewed with patient and at this time her to try a customized orthotics to disperse weight off the joint with consideration long-term that this may require surgical intervention with digital stabilization possible elevating osteotomy and possible neuroma excision

## 2015-07-14 ENCOUNTER — Ambulatory Visit: Payer: 59 | Admitting: *Deleted

## 2015-07-14 DIAGNOSIS — M779 Enthesopathy, unspecified: Secondary | ICD-10-CM

## 2015-07-14 NOTE — Patient Instructions (Signed)

## 2015-07-14 NOTE — Progress Notes (Signed)
Patient ID: Stuart Gaines, male   DOB: Oct 23, 1956, 59 y.o.   MRN: [id number] Patient presents for orthotic pick up.  Verbal and written break in and wear instructions given.  Patient will follow up in 4 weeks if symptoms worsen or fail to improve.

## 2017-01-21 DIAGNOSIS — Z125 Encounter for screening for malignant neoplasm of prostate: Secondary | ICD-10-CM | POA: Diagnosis not present

## 2017-01-21 DIAGNOSIS — K219 Gastro-esophageal reflux disease without esophagitis: Secondary | ICD-10-CM | POA: Diagnosis not present

## 2017-01-21 DIAGNOSIS — I10 Essential (primary) hypertension: Secondary | ICD-10-CM | POA: Diagnosis not present

## 2017-01-21 DIAGNOSIS — Z0000 Encounter for general adult medical examination without abnormal findings: Secondary | ICD-10-CM | POA: Diagnosis not present

## 2017-01-21 DIAGNOSIS — E7800 Pure hypercholesterolemia, unspecified: Secondary | ICD-10-CM | POA: Diagnosis not present

## 2017-04-24 DIAGNOSIS — J069 Acute upper respiratory infection, unspecified: Secondary | ICD-10-CM | POA: Diagnosis not present

## 2017-06-10 ENCOUNTER — Ambulatory Visit: Payer: 59 | Admitting: Allergy and Immunology

## 2017-06-30 DIAGNOSIS — E7800 Pure hypercholesterolemia, unspecified: Secondary | ICD-10-CM | POA: Diagnosis not present

## 2017-06-30 DIAGNOSIS — K219 Gastro-esophageal reflux disease without esophagitis: Secondary | ICD-10-CM | POA: Diagnosis not present

## 2017-06-30 DIAGNOSIS — I10 Essential (primary) hypertension: Secondary | ICD-10-CM | POA: Diagnosis not present

## 2017-09-24 DIAGNOSIS — Z23 Encounter for immunization: Secondary | ICD-10-CM | POA: Diagnosis not present

## 2017-12-01 DIAGNOSIS — M79671 Pain in right foot: Secondary | ICD-10-CM | POA: Diagnosis not present

## 2017-12-01 DIAGNOSIS — M14671 Charcot's joint, right ankle and foot: Secondary | ICD-10-CM | POA: Diagnosis not present

## 2017-12-01 DIAGNOSIS — S92901A Unspecified fracture of right foot, initial encounter for closed fracture: Secondary | ICD-10-CM | POA: Diagnosis not present

## 2018-01-15 DIAGNOSIS — R599 Enlarged lymph nodes, unspecified: Secondary | ICD-10-CM | POA: Diagnosis not present

## 2018-01-15 DIAGNOSIS — M67441 Ganglion, right hand: Secondary | ICD-10-CM | POA: Diagnosis not present

## 2018-01-15 DIAGNOSIS — R253 Fasciculation: Secondary | ICD-10-CM | POA: Diagnosis not present

## 2018-01-19 DIAGNOSIS — M14672 Charcot's joint, left ankle and foot: Secondary | ICD-10-CM | POA: Diagnosis not present

## 2018-01-19 DIAGNOSIS — S93314S Dislocation of tarsal joint of right foot, sequela: Secondary | ICD-10-CM | POA: Diagnosis not present

## 2018-01-21 DIAGNOSIS — R2231 Localized swelling, mass and lump, right upper limb: Secondary | ICD-10-CM | POA: Diagnosis not present

## 2018-02-03 DIAGNOSIS — K219 Gastro-esophageal reflux disease without esophagitis: Secondary | ICD-10-CM | POA: Diagnosis not present

## 2018-02-03 DIAGNOSIS — Z1159 Encounter for screening for other viral diseases: Secondary | ICD-10-CM | POA: Diagnosis not present

## 2018-02-03 DIAGNOSIS — E7800 Pure hypercholesterolemia, unspecified: Secondary | ICD-10-CM | POA: Diagnosis not present

## 2018-02-03 DIAGNOSIS — Z0000 Encounter for general adult medical examination without abnormal findings: Secondary | ICD-10-CM | POA: Diagnosis not present

## 2018-02-03 DIAGNOSIS — I10 Essential (primary) hypertension: Secondary | ICD-10-CM | POA: Diagnosis not present

## 2018-02-16 DIAGNOSIS — R2230 Localized swelling, mass and lump, unspecified upper limb: Secondary | ICD-10-CM | POA: Insufficient documentation

## 2018-03-09 DIAGNOSIS — R2231 Localized swelling, mass and lump, right upper limb: Secondary | ICD-10-CM | POA: Diagnosis not present

## 2018-03-09 DIAGNOSIS — M67441 Ganglion, right hand: Secondary | ICD-10-CM | POA: Diagnosis not present

## 2018-03-09 DIAGNOSIS — M24041 Loose body in right finger joint(s): Secondary | ICD-10-CM | POA: Diagnosis not present

## 2018-03-18 DIAGNOSIS — Z5189 Encounter for other specified aftercare: Secondary | ICD-10-CM | POA: Insufficient documentation

## 2018-06-26 DIAGNOSIS — M79672 Pain in left foot: Secondary | ICD-10-CM | POA: Diagnosis not present

## 2018-06-26 DIAGNOSIS — M14672 Charcot's joint, left ankle and foot: Secondary | ICD-10-CM | POA: Diagnosis not present

## 2018-08-19 DIAGNOSIS — K219 Gastro-esophageal reflux disease without esophagitis: Secondary | ICD-10-CM | POA: Diagnosis not present

## 2018-08-19 DIAGNOSIS — E7800 Pure hypercholesterolemia, unspecified: Secondary | ICD-10-CM | POA: Diagnosis not present

## 2018-08-19 DIAGNOSIS — R739 Hyperglycemia, unspecified: Secondary | ICD-10-CM | POA: Diagnosis not present

## 2018-08-19 DIAGNOSIS — Z23 Encounter for immunization: Secondary | ICD-10-CM | POA: Diagnosis not present

## 2018-08-20 ENCOUNTER — Ambulatory Visit
Admission: RE | Admit: 2018-08-20 | Discharge: 2018-08-20 | Disposition: A | Discharge status: Home / Self Care | Payer: 59 | Source: Ambulatory Visit | Attending: Family Medicine | Admitting: Family Medicine

## 2018-08-20 ENCOUNTER — Other Ambulatory Visit: Payer: Self-pay | Admitting: Family Medicine

## 2018-08-20 DIAGNOSIS — Z77090 Contact with and (suspected) exposure to asbestos: Secondary | ICD-10-CM

## 2018-08-20 DIAGNOSIS — R918 Other nonspecific abnormal finding of lung field: Secondary | ICD-10-CM | POA: Diagnosis not present

## 2018-11-16 DIAGNOSIS — Z0100 Encounter for examination of eyes and vision without abnormal findings: Secondary | ICD-10-CM | POA: Diagnosis not present

## 2019-02-15 DIAGNOSIS — K219 Gastro-esophageal reflux disease without esophagitis: Secondary | ICD-10-CM | POA: Diagnosis not present

## 2019-02-15 DIAGNOSIS — R739 Hyperglycemia, unspecified: Secondary | ICD-10-CM | POA: Diagnosis not present

## 2019-02-15 DIAGNOSIS — E7800 Pure hypercholesterolemia, unspecified: Secondary | ICD-10-CM | POA: Diagnosis not present

## 2019-02-15 DIAGNOSIS — Z125 Encounter for screening for malignant neoplasm of prostate: Secondary | ICD-10-CM | POA: Diagnosis not present

## 2019-02-15 DIAGNOSIS — I10 Essential (primary) hypertension: Secondary | ICD-10-CM | POA: Diagnosis not present

## 2019-05-18 DIAGNOSIS — M19079 Primary osteoarthritis, unspecified ankle and foot: Secondary | ICD-10-CM | POA: Insufficient documentation

## 2019-07-23 DIAGNOSIS — M14679 Charcot's joint, unspecified ankle and foot: Secondary | ICD-10-CM | POA: Insufficient documentation

## 2019-09-28 ENCOUNTER — Telehealth: Payer: Self-pay | Admitting: Hematology and Oncology

## 2019-09-28 NOTE — Telephone Encounter (Signed)
Received a new hem referral from Dr. Maury Dus for hemochromatosis and ferritin level of 117. Stuart Gaines returned my call and has been scheduled to see Dr. Lorenso Courier on 11/17 at 10am. He's been made aware to arrive 15 minutes early.

## 2019-10-05 ENCOUNTER — Inpatient Hospital Stay: Payer: 59 | Attending: Hematology and Oncology | Admitting: Hematology and Oncology

## 2019-10-05 ENCOUNTER — Other Ambulatory Visit: Payer: Self-pay

## 2019-10-05 ENCOUNTER — Encounter: Payer: Self-pay | Admitting: Hematology and Oncology

## 2019-10-05 ENCOUNTER — Inpatient Hospital Stay: Payer: 59

## 2019-10-05 DIAGNOSIS — E785 Hyperlipidemia, unspecified: Secondary | ICD-10-CM | POA: Insufficient documentation

## 2019-10-05 DIAGNOSIS — Z79899 Other long term (current) drug therapy: Secondary | ICD-10-CM | POA: Diagnosis not present

## 2019-10-05 DIAGNOSIS — E119 Type 2 diabetes mellitus without complications: Secondary | ICD-10-CM | POA: Insufficient documentation

## 2019-10-05 DIAGNOSIS — I10 Essential (primary) hypertension: Secondary | ICD-10-CM | POA: Insufficient documentation

## 2019-10-05 LAB — CBC WITH DIFFERENTIAL (CANCER CENTER ONLY)
Abs Immature Granulocytes: 0.01 10*3/uL (ref 0.00–0.07)
Basophils Absolute: 0 10*3/uL (ref 0.0–0.1)
Basophils Relative: 0 %
Eosinophils Absolute: 0.1 10*3/uL (ref 0.0–0.5)
Eosinophils Relative: 1 %
HCT: 45.8 % (ref 39.0–52.0)
Hemoglobin: 16.2 g/dL (ref 13.0–17.0)
Immature Granulocytes: 0 %
Lymphocytes Relative: 36 %
Lymphs Abs: 2.4 10*3/uL (ref 0.7–4.0)
MCH: 32.8 pg (ref 26.0–34.0)
MCHC: 35.4 g/dL (ref 30.0–36.0)
MCV: 92.7 fL (ref 80.0–100.0)
Monocytes Absolute: 0.4 10*3/uL (ref 0.1–1.0)
Monocytes Relative: 6 %
Neutro Abs: 3.7 10*3/uL (ref 1.7–7.7)
Neutrophils Relative %: 57 %
Platelet Count: 260 10*3/uL (ref 150–400)
RBC: 4.94 MIL/uL (ref 4.22–5.81)
RDW: 12.3 % (ref 11.5–15.5)
WBC Count: 6.7 10*3/uL (ref 4.0–10.5)
nRBC: 0 % (ref 0.0–0.2)

## 2019-10-05 LAB — CMP (CANCER CENTER ONLY)
ALT: 32 U/L (ref 0–44)
AST: 24 U/L (ref 15–41)
Albumin: 4.4 g/dL (ref 3.5–5.0)
Alkaline Phosphatase: 154 U/L — ABNORMAL HIGH (ref 38–126)
Anion gap: 11 (ref 5–15)
BUN: 12 mg/dL (ref 8–23)
CO2: 25 mmol/L (ref 22–32)
Calcium: 9.5 mg/dL (ref 8.9–10.3)
Chloride: 103 mmol/L (ref 98–111)
Creatinine: 0.99 mg/dL (ref 0.61–1.24)
GFR, Est AFR Am: >60 mL/min (ref >60)
GFR, Estimated: >60 mL/min (ref >60)
Glucose, Bld: 98 mg/dL (ref 70–99)
Potassium: 4.6 mmol/L (ref 3.5–5.1)
Sodium: 139 mmol/L (ref 135–145)
Total Bilirubin: 0.8 mg/dL (ref 0.3–1.2)
Total Protein: 7.7 g/dL (ref 6.5–8.1)

## 2019-10-05 LAB — FERRITIN: Ferritin: 107 ng/mL (ref 24–336)

## 2019-10-05 LAB — PROTIME-INR
INR: 1 (ref 0.8–1.2)
Prothrombin Time: 13.3 seconds (ref 11.4–15.2)

## 2019-10-05 LAB — IRON AND TIBC
Iron: 199 ug/dL — ABNORMAL HIGH (ref 42–163)
Saturation Ratios: 82 % — ABNORMAL HIGH (ref 20–55)
TIBC: 241 ug/dL (ref 202–409)
UIBC: 42 ug/dL — ABNORMAL LOW (ref 117–376)

## 2019-10-05 LAB — TSH: TSH: 0.681 u[IU]/mL (ref 0.320–4.118)

## 2019-10-05 NOTE — Progress Notes (Signed)
[place] [phone]   Fax:([phone]) [place] NOTE  Patient Care Team: Maury Dus, MD as PCP - General (Family Medicine)  Hematological/Oncological History # Evaluation of Hereditary Hemachromatosis 1) 09/13/2019: Transferrin 189, Ferritin 117.4. PCP referred to hematology for further evaluation and management.  2) 10/05/2019: establish care with Dr. Lorenso Courier   CHIEF COMPLAINTS/PURPOSE OF CONSULTATION:  Evaluation for hemachromatosis  HISTORY OF PRESENTING ILLNESS:  Stuart Gaines 63 y.o. male with medical history significant for GERD, HTN, HLD, and reported hemachromatosis who presents for evaluation for hemachromatosis.  On review of the outside records Stuart Gaines is followed by  Dr. Maury Dus at [facility]. His hemachromatosis has been followed by his PCP and prior to that his gastroenterologist. He notes he was diagnosed in 1990-1991, when he developed stomach ulcers 2/2 to heavy motrin use. He reports that he last received phlebotomy in 2012 while under the care of Dr. Amedeo Plenty. In the interim him PCP has been following his CBCs.  On exam today Stuart Gaines notes he feels well. He requested the referral because he felt more comfortable with a hematological specialist managing his hemachromatosis. He notes the diagnosis was made with a biopsy, but is unsure if he underwent genetic testing. Reportedly no one else in his family carries the diagnosis, including his children who were both tested. Symptomatically Stuart Gaines states he feels well, with no symptoms today. His foot is in a brace due to complications from an old fracture 25 years ago that required new attention.   He notes no complications from his [facility], noting that he does not have any history of DM type II (just pre-DM) or liver/thyroid dysfunction. He does not take tylenol and avoids ETOH use. He smoked very briefly for 6 months in his 48s. He does note the sound of rushing blood in his  ears while trying to sleep, though he notes he sits up awake due to his chronic back pain.   A full 10 point ROS is listed below.   MEDICAL HISTORY:  Past Medical History:  Diagnosis Date   High cholesterol    Hypertension     SURGICAL HISTORY: Past Surgical History:  Procedure Laterality Date   BACK SURGERY     CHOLECYSTECTOMY      SOCIAL HISTORY: Social History   Socioeconomic History   Marital status: Married    Spouse name: Not on file   Number of children: 2   Years of education: Not on file   Highest education level: Not on file  Occupational History   Not on file  Social Needs   Financial resource strain: Not on file   Food insecurity    Worry: Not on file    Inability: Not on file   Transportation needs    Medical: Not on file    Non-medical: Not on file  Tobacco Use   Smoking status: Former Smoker    Packs/day: 1.00    Years: 0.50    Pack years: 0.50    Types: Cigarettes    Quit date: 1975    Years since quitting: 45.9  Substance and Sexual Activity   Alcohol use: No   Drug use: Not on file   Sexual activity: Not on file  Lifestyle   Physical activity    Days per week: Not on file    Minutes per session: Not on file   Stress: Not on file  Relationships   Social connections    Talks on phone:  Not on file    Gets together: Not on file    Attends religious service: Not on file    Active member of club or organization: Not on file    Attends meetings of clubs or organizations: Not on file    Relationship status: Not on file   Intimate partner violence    Fear of current or ex partner: Not on file    Emotionally abused: Not on file    Physically abused: Not on file    Forced sexual activity: Not on file  Other Topics Concern   Not on file  Social History Narrative   Not on file    FAMILY HISTORY: Family History  Problem Relation Age of Onset   Anemia Mother    Stroke Father    Anemia Father    Diabetes  Brother    Stroke Paternal Grandmother     ALLERGIES:  is allergic to ampicillin.  MEDICATIONS:  Current Outpatient Medications  Medication Sig Dispense Refill   atorvastatin (LIPITOR) 80 MG tablet Take 40 mg by mouth daily.     diclofenac (VOLTAREN) 75 MG EC tablet Take 75 mg by mouth daily.     HYDROcodone-acetaminophen (NORCO) 7.5-325 MG per tablet Take 1 tablet by mouth 2 (two) times daily as needed (for pain).      ibuprofen (ADVIL,MOTRIN) 800 MG tablet Take 1 tablet (800 mg total) by mouth every 8 (eight) hours as needed. 21 tablet 0   lisinopril (PRINIVIL,ZESTRIL) 20 MG tablet Take 20 mg by mouth daily.     omeprazole (PRILOSEC) 20 MG capsule Take 20 mg by mouth daily.     VENTOLIN HFA 108 (90 BASE) MCG/ACT inhaler Inhale 1-2 puffs into the lungs as needed.      No current facility-administered medications for this visit.     REVIEW OF SYSTEMS:   Constitutional: ( - ) fevers, ( - )  chills , ( - ) night sweats Eyes: ( - ) blurriness of vision, ( - ) double vision, ( - ) watery eyes Ears, nose, mouth, throat, and face: ( - ) mucositis, ( - ) sore throat Respiratory: ( - ) cough, ( - ) dyspnea, ( - ) wheezes Cardiovascular: ( - ) palpitation, ( - ) chest discomfort, ( - ) lower extremity swelling Gastrointestinal:  ( - ) nausea, ( - ) heartburn, ( - ) change in bowel habits Skin: ( - ) abnormal skin rashes Lymphatics: ( - ) new lymphadenopathy, ( - ) easy bruising Neurological: ( - ) numbness, ( - ) tingling, ( - ) new weaknesses Behavioral/Psych: ( - ) mood change, ( - ) new changes  All other systems were reviewed with the patient and are negative.  PHYSICAL EXAMINATION: ECOG PERFORMANCE STATUS: 1 - Symptomatic but completely ambulatory  Vitals:   10/05/19 0940  BP: 135/82  Pulse: 84  Resp: 18  Temp: 98.3 F (36.8 C)  SpO2: 98%   Filed Weights   10/05/19 0940  Weight: 220 lb 11.2 oz (100.1 kg)    GENERAL: well appearing elderly Caucasian male in NAD    SKIN: skin color, texture, turgor are normal, no rashes or significant lesions EYES: conjunctiva are pink and non-injected, sclera clear LUNGS: clear to auscultation and percussion with normal breathing effort HEART: regular rate & rhythm and no murmurs and no lower extremity edema ABDOMEN: soft, non-tender, non-distended, normal bowel sounds Musculoskeletal: no cyanosis of digits and no clubbing  PSYCH: alert & oriented x 3, fluent  speech NEURO: no focal motor/sensory deficits  LABORATORY DATA:  I have reviewed the data as listed Lab Results  Component Value Date   WBC 6.7 10/05/2019   HGB 16.2 10/05/2019   HCT 45.8 10/05/2019   MCV 92.7 10/05/2019   PLT 260 10/05/2019   NEUTROABS 3.7 10/05/2019    PATHOLOGY: No pertinent pathology to review  RADIOGRAPHIC STUDIES: No relevant radiographic imaging  ASSESSMENT & PLAN EVERSON MOTT 63 y.o. male with medical history significant for GERD, HTN, HLD, and reported hemachromatosis who presents for evaluation for hemachromatosis. On review of the records there does not appear to be confirmatory testing for his hemachromatosis, though he was diagnosed in 1991 and was last seen/treated for this condition in 2012. Our first step will be confirmatory HH gene testing.   His last labs showed a ferritin of 117, which is elevated, but not to the degree we would worry about end organ damage. Our target ferritin will be >50 once phlebotomies have started (Blood (2010) 116 (3): 317-325.). We will postpone the start until 10/26/2019 to assure we have adequate time to have his confirmatory genetic testing return. Also, while he is here today we will test for end organ function with PT/INR and Cmp to assess liver function with TSH to check thyroid functioning. Patient reportedly has pre-DM type II followed by his PCP.   #Evaluation for Hemachromatosis --will order a baseline CMP, PT/INR, and CBC today  --repeat iron panel and ferritin levels  today --will order CRP/ESR to assure ferritin is not elevated due to inflammation --we do not currently have documented evidence of a C282Y or H63D HFE mutation. Will order these today to confirm the diagnosis of Hemachromatosis. --tenatively plan for phlebotomy on 10/26/2019, with a f/u appointment in 2 months.   Orders Placed This Encounter  Procedures   CBC with Differential ([facility] Only)    Standing Status:   Future    Number of Occurrences:   1    Standing Expiration Date:   10/04/2020   CMP ([facility] only)    Standing Status:   Future    Number of Occurrences:   1    Standing Expiration Date:   10/04/2020   Protime-INR    Standing Status:   Future    Number of Occurrences:   1    Standing Expiration Date:   10/04/2020   Ferritin    Standing Status:   Future    Number of Occurrences:   1    Standing Expiration Date:   10/04/2020   Iron and TIBC    Standing Status:   Future    Number of Occurrences:   1    Standing Expiration Date:   10/04/2020   TSH    Standing Status:   Future    Number of Occurrences:   1    Standing Expiration Date:   10/04/2020   Hemochromatosis DNA, PCR    Standing Status:   Future    Number of Occurrences:   1    Standing Expiration Date:   10/04/2020    All questions were answered. The patient knows to call the clinic with any problems, questions or concerns.  A total of more than 60 minutes were spent face-to-face with the patient during this encounter and over half of that time was spent on counseling and coordination of care as outlined above.   Ledell Peoples, MD Department of Hematology/Oncology [place] at [facility] Phone:  [phone] Pager: [phone] Email: [email or web address]  10/05/2019 12:24 PM

## 2019-10-06 ENCOUNTER — Telehealth: Payer: Self-pay | Admitting: Hematology and Oncology

## 2019-10-06 NOTE — Telephone Encounter (Signed)
Scheduled per los. Called and left msg. Mailed printout  °

## 2019-10-11 LAB — HEMOCHROMATOSIS DNA-PCR(C282Y,H63D)

## 2019-10-26 ENCOUNTER — Inpatient Hospital Stay: Payer: 59 | Attending: Hematology and Oncology

## 2019-10-26 ENCOUNTER — Other Ambulatory Visit: Payer: Self-pay

## 2019-10-26 NOTE — Patient Instructions (Signed)

## 2019-10-26 NOTE — Progress Notes (Signed)
Plebotomy completed without complaints. 500cc of blood removed from R AC.  Pt. Tolerated well.

## 2019-11-04 ENCOUNTER — Ambulatory Visit: Payer: 59 | Attending: Internal Medicine

## 2019-11-04 DIAGNOSIS — Z20822 Contact with and (suspected) exposure to covid-19: Secondary | ICD-10-CM

## 2019-11-05 LAB — NOVEL CORONAVIRUS, NAA: SARS-CoV-2, NAA: NOT DETECTED

## 2019-11-17 ENCOUNTER — Telehealth: Payer: Self-pay | Admitting: Hematology and Oncology

## 2019-11-17 NOTE — Telephone Encounter (Signed)
Scheduled per 11/17 los. Called and left msg. Mailing printout

## 2019-12-06 ENCOUNTER — Inpatient Hospital Stay: Payer: 59

## 2019-12-06 ENCOUNTER — Encounter: Payer: Self-pay | Admitting: Hematology and Oncology

## 2019-12-06 ENCOUNTER — Inpatient Hospital Stay: Payer: 59 | Attending: Hematology and Oncology | Admitting: Hematology and Oncology

## 2019-12-06 ENCOUNTER — Other Ambulatory Visit: Payer: Self-pay

## 2019-12-06 DIAGNOSIS — Z87891 Personal history of nicotine dependence: Secondary | ICD-10-CM | POA: Diagnosis not present

## 2019-12-06 DIAGNOSIS — Z79899 Other long term (current) drug therapy: Secondary | ICD-10-CM | POA: Insufficient documentation

## 2019-12-06 DIAGNOSIS — E7800 Pure hypercholesterolemia, unspecified: Secondary | ICD-10-CM | POA: Insufficient documentation

## 2019-12-06 DIAGNOSIS — I10 Essential (primary) hypertension: Secondary | ICD-10-CM | POA: Diagnosis not present

## 2019-12-06 LAB — FERRITIN: Ferritin: 249 ng/mL (ref 24–336)

## 2019-12-06 LAB — CBC WITH DIFFERENTIAL (CANCER CENTER ONLY)
Abs Immature Granulocytes: 0 10*3/uL (ref 0.00–0.07)
Basophils Absolute: 0.1 10*3/uL (ref 0.0–0.1)
Basophils Relative: 1 %
Eosinophils Absolute: 0.2 10*3/uL (ref 0.0–0.5)
Eosinophils Relative: 3 %
HCT: 44.3 % (ref 39.0–52.0)
Hemoglobin: 15.7 g/dL (ref 13.0–17.0)
Immature Granulocytes: 0 %
Lymphocytes Relative: 41 %
Lymphs Abs: 2.4 10*3/uL (ref 0.7–4.0)
MCH: 32.6 pg (ref 26.0–34.0)
MCHC: 35.4 g/dL (ref 30.0–36.0)
MCV: 91.9 fL (ref 80.0–100.0)
Monocytes Absolute: 0.4 10*3/uL (ref 0.1–1.0)
Monocytes Relative: 7 %
Neutro Abs: 2.8 10*3/uL (ref 1.7–7.7)
Neutrophils Relative %: 48 %
Platelet Count: 209 10*3/uL (ref 150–400)
RBC: 4.82 MIL/uL (ref 4.22–5.81)
RDW: 12.6 % (ref 11.5–15.5)
WBC Count: 5.9 10*3/uL (ref 4.0–10.5)
nRBC: 0 % (ref 0.0–0.2)

## 2019-12-06 LAB — CMP (CANCER CENTER ONLY)
ALT: 37 U/L (ref 0–44)
AST: 27 U/L (ref 15–41)
Albumin: 4.3 g/dL (ref 3.5–5.0)
Alkaline Phosphatase: 115 U/L (ref 38–126)
Anion gap: 9 (ref 5–15)
BUN: 13 mg/dL (ref 8–23)
CO2: 27 mmol/L (ref 22–32)
Calcium: 9 mg/dL (ref 8.9–10.3)
Chloride: 105 mmol/L (ref 98–111)
Creatinine: 1.13 mg/dL (ref 0.61–1.24)
GFR, Est AFR Am: >60 mL/min (ref >60)
GFR, Estimated: >60 mL/min (ref >60)
Glucose, Bld: 111 mg/dL — ABNORMAL HIGH (ref 70–99)
Potassium: 4.8 mmol/L (ref 3.5–5.1)
Sodium: 141 mmol/L (ref 135–145)
Total Bilirubin: 0.8 mg/dL (ref 0.3–1.2)
Total Protein: 7.2 g/dL (ref 6.5–8.1)

## 2019-12-06 LAB — IRON AND TIBC
Iron: 234 ug/dL — ABNORMAL HIGH (ref 42–163)
Saturation Ratios: 104 % — ABNORMAL HIGH (ref 20–55)
TIBC: 225 ug/dL (ref 202–409)
UIBC: UNDETERMINED ug/dL (ref 117–376)

## 2019-12-06 NOTE — Progress Notes (Signed)
[place] Telephone:([phone]   Fax:([phone]  PROGRESS NOTE  Patient Care Team: Maury Dus, MD as PCP - General (Family Medicine)  Hematological/Oncological History # Hereditary Hemachromatosis (Homozygous C282Y) 1) 09/13/2019: Transferrin 189, Ferritin 117.4. PCP referred to hematology for further evaluation and management.  2) 10/05/2019: establish care with Dr. Lorenso Courier  3) 10/26/2019: Phlebotomy #1  Interval History:  Stuart Gaines 64 y.o. male with medical history significant for hereditary hemachromatosis presents for a follow up visit. The patient's last visit was on 10/05/2019. In the interim since the last visit he had a phlebotomy performed on 10/26/2019.   On exam today today he reports that he had alleviation of his symptoms for 6 days following his last lobotomy.  The primary symptoms he endorses is thumping in his ears as well as pressure in his chest.  He also notes that he tolerated the body well without any lightheadedness or dizziness.  He reports that he has had a new boot placed on his left foot and that he will be required to wear this for 1 year.  He notes no other major changes in his health other than a 10 pound weight increase over the holiday.  He reports that he has not had any fevers, chills, sweats, nausea, vomiting or diarrhea.  A full 10 point ROS is listed below.  Furthermore today we discussed the utility of the phlebotomies and scheduling.  We settled on every 2 week phlebotomies with frequent blood checks to assure we are not dropping his levels too low.  The patient was in agreement with this plan.  MEDICAL HISTORY:  Past Medical History:  Diagnosis Date  . High cholesterol   . Hypertension     SURGICAL HISTORY: Past Surgical History:  Procedure Laterality Date  . BACK SURGERY    . CHOLECYSTECTOMY      SOCIAL HISTORY: Social History   Socioeconomic History  . Marital status: Married    Spouse name: Not on file   . Number of children: 2  . Years of education: Not on file  . Highest education level: Not on file  Occupational History  . Not on file  Tobacco Use  . Smoking status: Former Smoker    Packs/day: 1.00    Years: 0.50    Pack years: 0.50    Types: Cigarettes    Quit date: 1975    Years since quitting: 46.0  Substance and Sexual Activity  . Alcohol use: No  . Drug use: Not on file  . Sexual activity: Not on file  Other Topics Concern  . Not on file  Social History Narrative  . Not on file   Social Determinants of Health   Financial Resource Strain:   . Difficulty of Paying Living Expenses: Not on file  Food Insecurity:   . Worried About Charity fundraiser in the Last Year: Not on file  . Ran Out of Food in the Last Year: Not on file  Transportation Needs:   . Lack of Transportation (Medical): Not on file  . Lack of Transportation (Non-Medical): Not on file  Physical Activity:   . Days of Exercise per Week: Not on file  . Minutes of Exercise per Session: Not on file  Stress:   . Feeling of Stress : Not on file  Social Connections:   . Frequency of Communication with Friends and Family: Not on file  . Frequency of Social Gatherings with Friends and Family: Not on file  .  Attends Religious Services: Not on file  . Active Member of Clubs or Organizations: Not on file  . Attends Archivist Meetings: Not on file  . Marital Status: Not on file  Intimate Partner Violence:   . Fear of Current or Ex-Partner: Not on file  . Emotionally Abused: Not on file  . Physically Abused: Not on file  . Sexually Abused: Not on file    FAMILY HISTORY: Family History  Problem Relation Age of Onset  . Anemia Mother   . Stroke Father   . Anemia Father   . Diabetes Brother   . Stroke Paternal Grandmother     ALLERGIES:  is allergic to ampicillin.  MEDICATIONS:  Current Outpatient Medications  Medication Sig Dispense Refill  . atorvastatin (LIPITOR) 80 MG tablet Take 40  mg by mouth daily.    . diclofenac (VOLTAREN) 75 MG EC tablet Take 75 mg by mouth daily.    [name] HYDROcodone-acetaminophen (NORCO) 7.5-325 MG per tablet Take 1 tablet by mouth 2 (two) times daily as needed (for pain).     [name] ibuprofen (ADVIL,MOTRIN) 800 MG tablet Take 1 tablet (800 mg total) by mouth every 8 (eight) hours as needed. 21 tablet 0  . lisinopril (PRINIVIL,ZESTRIL) 20 MG tablet Take 20 mg by mouth daily.    [name] omeprazole (PRILOSEC) 20 MG capsule Take 20 mg by mouth daily.    . VENTOLIN HFA 108 (90 BASE) MCG/ACT inhaler Inhale 1-2 puffs into the lungs as needed.      No current facility-administered medications for this visit.    REVIEW OF SYSTEMS:   Constitutional: ( - ) fevers, ( - )  chills , ( - ) night sweats Eyes: ( - ) blurriness of vision, ( - ) double vision, ( - ) watery eyes Ears, nose, mouth, throat, and face: ( - ) mucositis, ( - ) sore throat Respiratory: ( - ) cough, ( - ) dyspnea, ( - ) wheezes Cardiovascular: ( - ) palpitation, ( + ) chest discomfort, ( - ) lower extremity swelling Gastrointestinal:  ( - ) nausea, ( - ) heartburn, ( - ) change in bowel habits Skin: ( - ) abnormal skin rashes Lymphatics: ( - ) new lymphadenopathy, ( - ) easy bruising Neurological: ( - ) numbness, ( - ) tingling, ( - ) new weaknesses Behavioral/Psych: ( - ) mood change, ( - ) new changes  All other systems were reviewed with the patient and are negative.  PHYSICAL EXAMINATION: ECOG PERFORMANCE STATUS: 1 - Symptomatic but completely ambulatory  Vitals:   12/06/19 0805  Pulse: 97  Resp: 18  Temp: (!) 97.3 F (36.3 C)  SpO2: 97%   Filed Weights   12/06/19 0805  Weight: 230 lb 4.8 oz (104.5 kg)    GENERAL: well appearing elderly Caucasian male in no distress and comfortable SKIN: skin color, texture, turgor are normal, no rashes or significant lesions EYES: conjunctiva are pink and non-injected, sclera clear LUNGS: clear to auscultation and percussion with normal breathing  effort HEART: regular rate & rhythm and no murmurs and no lower extremity edema Musculoskeletal: no cyanosis of digits and no clubbing. Left foot in large orthopedic boot.  PSYCH: alert & oriented x 3, fluent speech NEURO: no focal motor/sensory deficits  LABORATORY DATA:  I have reviewed the data as listed CBC Latest Ref Rng & Units 12/06/2019 10/05/2019 03/24/2010  WBC 4.0 - 10.5 K/uL 5.9 6.7 12.3(H)  Hemoglobin 13.0 - 17.0 g/dL 15.7 16.2 14.8  Hematocrit 39.0 - 52.0 % 44.3 45.8 43.2  Platelets 150 - 400 K/uL 209 260 212    CMP Latest Ref Rng & Units 10/05/2019 03/24/2010 03/29/2008  Glucose 70 - 99 mg/dL 98 100(H) 83  BUN 8 - 23 mg/dL 12 11 -  Creatinine 0.61 - 1.24 mg/dL 0.99 1.05 -  Sodium 135 - 145 mmol/L 139 137 140  Potassium 3.5 - 5.1 mmol/L 4.6 4.0 4.6  Chloride 98 - 111 mmol/L 103 104 -  CO2 22 - 32 mmol/L 25 25 -  Calcium 8.9 - 10.3 mg/dL 9.5 9.4 -  Total Protein 6.5 - 8.1 g/dL 7.7 - -  Total Bilirubin 0.3 - 1.2 mg/dL 0.8 - -  Alkaline Phos 38 - 126 U/L 154(H) - -  AST 15 - 41 U/L 24 - -  ALT 0 - 44 U/L 32 - -   Iron/TIBC/Ferritin/ %Sat    Component Value Date/Time   IRON 199 (H) 10/05/2019 1101   TIBC 241 10/05/2019 1101   FERRITIN 107 10/05/2019 1101   IRONPCTSAT 82 (H) 10/05/2019 1101     RADIOGRAPHIC STUDIES: None to review.  ASSESSMENT & PLAN Stuart Gaines 64 y.o. male with medical history significant for hereditary hemachromatosis presents for a follow up visit.  Review of the labs and discussion with the patient his findings are most consistent with a hereditary hemochromatosis secondary to a homozygous C282Y mutation.  As such I do think it would be reasonable to proceed forward with routine phlebotomies in order to decrease his ferritin level.  We will set an aggressive ferritin goal of ferritin less than 50.  We will try to balance this out and assure that we do not rapidly drop his hemoglobin to cause him to have any symptoms of lightheadedness or  dizziness.  Additional baseline labs have shown that he has a normal TSH and prediabetes.  His ferritin level is elevated over 100 and his other iron labs are within normal limits.  In order to assure he has no permanent deficits from the iron deposition we will order an echocardiogram today to assess his cardiac function.  Given that his ferritin level is not dramatically high we will hold on performing an MRI of the liver at this time, though it is notable he has normal LFTs.  #Hereditary Hemachromatosis (Homozygous C282Y) --repeat CMP and CBC today  --repeat iron panel and ferritin levels today --plan to start phlebotomy q 2 weeks starting this week and to continue until ferritin is <50 --will order an Echocardiogram to assure the patient has good baseline cardiac function and does not have any defects due to iron deposition.  --tenatively plan for phlebotomy this week with repeat phlebotomies q 2 weeks. F/u in clinic for MD visit in 2 months.   , Orders Placed This Encounter  Procedures  . CBC with Differential ([facility] Only)    Standing Status:   Future    Number of Occurrences:   1    Standing Expiration Date:   12/05/2020  . CMP ([facility] only)    Standing Status:   Future    Number of Occurrences:   1    Standing Expiration Date:   12/05/2020  . Iron and TIBC    Standing Status:   Future    Number of Occurrences:   1    Standing Expiration Date:   12/05/2020  . Ferritin    Standing Status:   Future    Number of Occurrences:  1    Standing Expiration Date:   12/05/2020    All questions were answered. The patient knows to call the clinic with any problems, questions or concerns.  A total of more than 25 minutes were spent on this encounter and over half of that time was spent on counseling and coordination of care as outlined above.   Ledell Peoples, MD Department of Hematology/Oncology [place] at [facility] Phone: [phone] Pager:  [phone] Email: [email or web address]  12/06/2019 9:23 AM

## 2019-12-07 ENCOUNTER — Telehealth: Payer: Self-pay | Admitting: Hematology and Oncology

## 2019-12-07 NOTE — Telephone Encounter (Signed)
Scheduled per los. Called and left msg. Mailed printout  °

## 2019-12-08 ENCOUNTER — Other Ambulatory Visit: Payer: Self-pay | Admitting: Hematology and Oncology

## 2019-12-10 ENCOUNTER — Other Ambulatory Visit: Payer: Self-pay

## 2019-12-10 ENCOUNTER — Other Ambulatory Visit: Payer: Medicare Other

## 2019-12-10 ENCOUNTER — Inpatient Hospital Stay: Payer: 59

## 2019-12-10 NOTE — Progress Notes (Signed)
Phlebotomy performed with kit. 524 obtained. Tolerated well. Patient DC'd after 30 min post observation. VSS.

## 2019-12-15 ENCOUNTER — Other Ambulatory Visit: Payer: Self-pay

## 2019-12-15 ENCOUNTER — Ambulatory Visit (HOSPITAL_COMMUNITY)
Admission: RE | Admit: 2019-12-15 | Discharge: 2019-12-15 | Disposition: A | Discharge status: Home / Self Care | Payer: 59 | Source: Ambulatory Visit | Attending: Hematology and Oncology | Admitting: Hematology and Oncology

## 2019-12-15 DIAGNOSIS — I10 Essential (primary) hypertension: Secondary | ICD-10-CM | POA: Diagnosis not present

## 2019-12-15 NOTE — Progress Notes (Signed)
  Echocardiogram 2D Echocardiogram has been performed.  Jennette Dubin 12/15/2019, 11:45 AM

## 2019-12-17 ENCOUNTER — Ambulatory Visit: Payer: Medicare Other | Attending: Internal Medicine

## 2019-12-17 DIAGNOSIS — Z20822 Contact with and (suspected) exposure to covid-19: Secondary | ICD-10-CM

## 2019-12-18 LAB — NOVEL CORONAVIRUS, NAA: SARS-CoV-2, NAA: NOT DETECTED

## 2019-12-22 ENCOUNTER — Telehealth: Payer: Self-pay | Admitting: *Deleted

## 2019-12-22 NOTE — Telephone Encounter (Signed)
Notified of message below

## 2019-12-22 NOTE — Telephone Encounter (Signed)
-----   Message from Otila Kluver, RN sent at 12/22/2019  4:15 PM EST -----  ----- Message ----- From: Orson Slick, MD Sent: 12/15/2019   6:36 PM EST To: Otila Kluver, RN  Please let Mr. Stuart Gaines know that his TTE looked good overall, with normal systolic function. Colan Neptune  ----- Message ----- From: Interface, [employer] Sent: 12/15/2019   4:31 PM EST To: Orson Slick, MD

## 2019-12-23 ENCOUNTER — Inpatient Hospital Stay: Payer: 59

## 2019-12-23 ENCOUNTER — Other Ambulatory Visit: Payer: Self-pay | Admitting: Hematology and Oncology

## 2019-12-23 ENCOUNTER — Inpatient Hospital Stay: Payer: 59 | Attending: Hematology and Oncology

## 2019-12-23 ENCOUNTER — Other Ambulatory Visit: Payer: Self-pay

## 2019-12-23 LAB — CBC WITH DIFFERENTIAL (CANCER CENTER ONLY)
Abs Immature Granulocytes: 0.02 10*3/uL (ref 0.00–0.07)
Basophils Absolute: 0.1 10*3/uL (ref 0.0–0.1)
Basophils Relative: 1 %
Eosinophils Absolute: 0.1 10*3/uL (ref 0.0–0.5)
Eosinophils Relative: 2 %
HCT: 42.7 % (ref 39.0–52.0)
Hemoglobin: 15.2 g/dL (ref 13.0–17.0)
Immature Granulocytes: 0 %
Lymphocytes Relative: 39 %
Lymphs Abs: 2.7 10*3/uL (ref 0.7–4.0)
MCH: 32.6 pg (ref 26.0–34.0)
MCHC: 35.6 g/dL (ref 30.0–36.0)
MCV: 91.6 fL (ref 80.0–100.0)
Monocytes Absolute: 0.5 10*3/uL (ref 0.1–1.0)
Monocytes Relative: 7 %
Neutro Abs: 3.6 10*3/uL (ref 1.7–7.7)
Neutrophils Relative %: 51 %
Platelet Count: 214 10*3/uL (ref 150–400)
RBC: 4.66 MIL/uL (ref 4.22–5.81)
RDW: 13 % (ref 11.5–15.5)
WBC Count: 6.9 10*3/uL (ref 4.0–10.5)
nRBC: 0 % (ref 0.0–0.2)

## 2019-12-23 LAB — CMP (CANCER CENTER ONLY)
ALT: 33 U/L (ref 0–44)
AST: 25 U/L (ref 15–41)
Albumin: 4.4 g/dL (ref 3.5–5.0)
Alkaline Phosphatase: 109 U/L (ref 38–126)
Anion gap: 6 (ref 5–15)
BUN: 12 mg/dL (ref 8–23)
CO2: 28 mmol/L (ref 22–32)
Calcium: 9.4 mg/dL (ref 8.9–10.3)
Chloride: 105 mmol/L (ref 98–111)
Creatinine: 0.89 mg/dL (ref 0.61–1.24)
GFR, Est AFR Am: >60 mL/min (ref >60)
GFR, Estimated: >60 mL/min (ref >60)
Glucose, Bld: 102 mg/dL — ABNORMAL HIGH (ref 70–99)
Potassium: 4.8 mmol/L (ref 3.5–5.1)
Sodium: 139 mmol/L (ref 135–145)
Total Bilirubin: 1 mg/dL (ref 0.3–1.2)
Total Protein: 7.3 g/dL (ref 6.5–8.1)

## 2019-12-23 LAB — FERRITIN: Ferritin: 145 ng/mL (ref 24–336)

## 2019-12-23 LAB — IRON AND TIBC
Iron: 211 ug/dL — ABNORMAL HIGH (ref 42–163)
Saturation Ratios: 91 % — ABNORMAL HIGH (ref 20–55)
TIBC: 231 ug/dL (ref 202–409)
UIBC: 21 ug/dL — ABNORMAL LOW (ref 117–376)

## 2019-12-23 NOTE — Patient Instructions (Signed)

## 2020-01-04 ENCOUNTER — Telehealth: Payer: Self-pay | Admitting: Hematology and Oncology

## 2020-01-04 NOTE — Telephone Encounter (Signed)
Returned patient's phone call regarding cancelling 02/18 appointment, per patient's request 02/18 appointments have been cancelled.  Message to provider.

## 2020-01-06 ENCOUNTER — Inpatient Hospital Stay: Payer: 59

## 2020-01-11 ENCOUNTER — Telehealth: Payer: Self-pay | Admitting: Hematology and Oncology

## 2020-01-11 NOTE — Telephone Encounter (Signed)
Patient has been called and notified of 02/26 appointments.

## 2020-01-13 ENCOUNTER — Other Ambulatory Visit: Payer: Self-pay

## 2020-01-14 ENCOUNTER — Inpatient Hospital Stay: Payer: 59

## 2020-01-14 ENCOUNTER — Other Ambulatory Visit: Payer: Self-pay

## 2020-01-14 LAB — CBC WITH DIFFERENTIAL (CANCER CENTER ONLY)
Abs Immature Granulocytes: 0.01 10*3/uL (ref 0.00–0.07)
Basophils Absolute: 0 10*3/uL (ref 0.0–0.1)
Basophils Relative: 1 %
Eosinophils Absolute: 0.1 10*3/uL (ref 0.0–0.5)
Eosinophils Relative: 1 %
HCT: 42.5 % (ref 39.0–52.0)
Hemoglobin: 15.1 g/dL (ref 13.0–17.0)
Immature Granulocytes: 0 %
Lymphocytes Relative: 47 %
Lymphs Abs: 2.8 10*3/uL (ref 0.7–4.0)
MCH: 33.9 pg (ref 26.0–34.0)
MCHC: 35.5 g/dL (ref 30.0–36.0)
MCV: 95.3 fL (ref 80.0–100.0)
Monocytes Absolute: 0.4 10*3/uL (ref 0.1–1.0)
Monocytes Relative: 7 %
Neutro Abs: 2.6 10*3/uL (ref 1.7–7.7)
Neutrophils Relative %: 44 %
Platelet Count: 202 10*3/uL (ref 150–400)
RBC: 4.46 MIL/uL (ref 4.22–5.81)
RDW: 12.7 % (ref 11.5–15.5)
WBC Count: 6 10*3/uL (ref 4.0–10.5)
nRBC: 0 % (ref 0.0–0.2)

## 2020-01-14 LAB — IRON AND TIBC
Iron: 217 ug/dL — ABNORMAL HIGH (ref 42–163)
Saturation Ratios: 98 % — ABNORMAL HIGH (ref 20–55)
TIBC: 221 ug/dL (ref 202–409)
UIBC: 4 ug/dL — ABNORMAL LOW (ref 117–376)

## 2020-01-14 LAB — FERRITIN: Ferritin: 117 ng/mL (ref 24–336)

## 2020-01-14 NOTE — Patient Instructions (Signed)
Therapeutic Phlebotomy Therapeutic phlebotomy is the planned removal of blood from a person's body for the purpose of treating a medical condition. The procedure is similar to donating blood. Usually, about a pint (470 mL, or 0.47 L) of blood is removed. The average adult has 9-12 pints (4.3-5.7 L) of blood in the body. Therapeutic phlebotomy may be used to treat the following medical conditions:  Hemochromatosis. This is a condition in which the blood contains too much iron.  Polycythemia vera. This is a condition in which the blood contains too many red blood cells.  Porphyria cutanea tarda. This is a disease in which an important part of hemoglobin is not made properly. It results in the buildup of abnormal amounts of porphyrins in the body.  Sickle cell disease. This is a condition in which the red blood cells form an abnormal crescent shape rather than a round shape. Tell a health care provider about:  Any allergies you have.  All medicines you are taking, including vitamins, herbs, eye drops, creams, and over-the-counter medicines.  Any problems you or family members have had with anesthetic medicines.  Any blood disorders you have.  Any surgeries you have had.  Any medical conditions you have.  Whether you are pregnant or may be pregnant. What are the risks? Generally, this is a safe procedure. However, problems may occur, including:  Nausea or light-headedness.  Low blood pressure (hypotension).  Soreness, bleeding, swelling, or bruising at the needle insertion site.  Infection. What happens before the procedure?  Follow instructions from your health care provider about eating or drinking restrictions.  Ask your health care provider about: ? Changing or stopping your regular medicines. This is especially important if you are taking diabetes medicines or blood thinners (anticoagulants). ? Taking medicines such as aspirin and ibuprofen. These medicines can thin your  blood. Do not take these medicines unless your health care provider tells you to take them. ? Taking over-the-counter medicines, vitamins, herbs, and supplements.  Wear clothing with sleeves that can be raised above the elbow.  Plan to have someone take you home from the hospital or clinic.  You may have a blood sample taken.  Your blood pressure, pulse rate, and breathing rate will be measured. What happens during the procedure?   To lower your risk of infection: ? Your health care team will wash or sanitize their hands. ? Your skin will be cleaned with an antiseptic.  You may be given a medicine to numb the area (local anesthetic).  A tourniquet will be placed on your arm.  A needle will be inserted into one of your veins.  Tubing and a collection bag will be attached to that needle.  Blood will flow through the needle and tubing into the collection bag.  The collection bag will be placed lower than your arm to allow gravity to help the flow of blood into the bag.  You may be asked to open and close your hand slowly and continually during the entire collection.  After the specified amount of blood has been removed from your body, the collection bag and tubing will be clamped.  The needle will be removed from your vein.  Pressure will be held on the site of the needle insertion to stop the bleeding.  A bandage (dressing) will be placed over the needle insertion site. The procedure may vary among health care providers and hospitals. What happens after the procedure?  Your blood pressure, pulse rate, and breathing rate will be   measured after the procedure.  You will be encouraged to drink fluids.  Your recovery will be assessed and monitored.  You can return to your normal activities as told by your health care provider. Summary  Therapeutic phlebotomy is the planned removal of blood from a person's body for the purpose of treating a medical condition.  Therapeutic  phlebotomy may be used to treat hemochromatosis, polycythemia vera, porphyria cutanea tarda, or sickle cell disease.  In the procedure, a needle is inserted and about a pint (470 mL, or 0.47 L) of blood is removed. The average adult has 9-12 pints (4.3-5.7 L) of blood in the body.  This is generally a safe procedure, but it can sometimes cause problems such as nausea, light-headedness, or low blood pressure (hypotension). This information is not intended to replace advice given to you by your health care provider. Make sure you discuss any questions you have with your health care provider. Document Revised: 11/20/2017 Document Reviewed: 11/20/2017 Elsevier Patient Education  2020 [employer]  

## 2020-01-14 NOTE — Progress Notes (Signed)
Per Dr Lorenso Courier, ok to proceed with phlebotomy without ferritin results.

## 2020-01-19 ENCOUNTER — Telehealth: Payer: Self-pay | Admitting: *Deleted

## 2020-01-19 NOTE — Telephone Encounter (Signed)
Per last office note, phlebotomies to be done every 2 weeks.  Last preformed 2/26.  Pt scheduled 3/4 for next treatment.  Per Dr. Lorenso Courier, pt should be cancelled and scheduled for next week.  This RN lvm with pt informing him to disregard tomorrow's appts.  Pt to be scheduled next week instead.  Scheduling message sent high priority.

## 2020-01-20 ENCOUNTER — Inpatient Hospital Stay: Payer: 59

## 2020-01-26 ENCOUNTER — Telehealth: Payer: Self-pay | Admitting: Hematology and Oncology

## 2020-01-26 NOTE — Telephone Encounter (Signed)
Called and spoke with patient. Confirmed change in appts per sch msg from MD

## 2020-01-27 ENCOUNTER — Inpatient Hospital Stay: Payer: 59 | Attending: Hematology and Oncology

## 2020-01-27 ENCOUNTER — Other Ambulatory Visit: Payer: Self-pay | Admitting: Hematology and Oncology

## 2020-01-27 ENCOUNTER — Inpatient Hospital Stay: Payer: 59

## 2020-01-27 ENCOUNTER — Other Ambulatory Visit: Payer: Self-pay

## 2020-01-27 LAB — CBC WITH DIFFERENTIAL (CANCER CENTER ONLY)
Abs Immature Granulocytes: 0.01 10*3/uL (ref 0.00–0.07)
Basophils Absolute: 0.1 10*3/uL (ref 0.0–0.1)
Basophils Relative: 1 %
Eosinophils Absolute: 0.1 10*3/uL (ref 0.0–0.5)
Eosinophils Relative: 2 %
HCT: 42.5 % (ref 39.0–52.0)
Hemoglobin: 15 g/dL (ref 13.0–17.0)
Immature Granulocytes: 0 %
Lymphocytes Relative: 41 %
Lymphs Abs: 2.4 10*3/uL (ref 0.7–4.0)
MCH: 33.4 pg (ref 26.0–34.0)
MCHC: 35.3 g/dL (ref 30.0–36.0)
MCV: 94.7 fL (ref 80.0–100.0)
Monocytes Absolute: 0.4 10*3/uL (ref 0.1–1.0)
Monocytes Relative: 7 %
Neutro Abs: 2.9 10*3/uL (ref 1.7–7.7)
Neutrophils Relative %: 49 %
Platelet Count: 215 10*3/uL (ref 150–400)
RBC: 4.49 MIL/uL (ref 4.22–5.81)
RDW: 12.8 % (ref 11.5–15.5)
WBC Count: 5.9 10*3/uL (ref 4.0–10.5)
nRBC: 0 % (ref 0.0–0.2)

## 2020-01-27 LAB — FERRITIN: Ferritin: 88 ng/mL (ref 24–336)

## 2020-01-27 NOTE — Progress Notes (Signed)
Pt presented today for phlebotomy per MD order. A 16 gauge needle was placed in the Right AC and 520 mls of blood was removed. The needle was removed with the catheter intact. Beverage was provided and the patient stayed the 30 min post observation. VSS

## 2020-01-27 NOTE — Patient Instructions (Signed)
Therapeutic Phlebotomy Therapeutic phlebotomy is the planned removal of blood from a person's body for the purpose of treating a medical condition. The procedure is similar to donating blood. Usually, about a pint (470 mL, or 0.47 L) of blood is removed. The average adult has 9-12 pints (4.3-5.7 L) of blood in the body. Therapeutic phlebotomy may be used to treat the following medical conditions:  Hemochromatosis. This is a condition in which the blood contains too much iron.  Polycythemia vera. This is a condition in which the blood contains too many red blood cells.  Porphyria cutanea tarda. This is a disease in which an important part of hemoglobin is not made properly. It results in the buildup of abnormal amounts of porphyrins in the body.  Sickle cell disease. This is a condition in which the red blood cells form an abnormal crescent shape rather than a round shape. Tell a health care provider about:  Any allergies you have.  All medicines you are taking, including vitamins, herbs, eye drops, creams, and over-the-counter medicines.  Any problems you or family members have had with anesthetic medicines.  Any blood disorders you have.  Any surgeries you have had.  Any medical conditions you have.  Whether you are pregnant or may be pregnant. What are the risks? Generally, this is a safe procedure. However, problems may occur, including:  Nausea or light-headedness.  Low blood pressure (hypotension).  Soreness, bleeding, swelling, or bruising at the needle insertion site.  Infection. What happens before the procedure?  Follow instructions from your health care provider about eating or drinking restrictions.  Ask your health care provider about: ? Changing or stopping your regular medicines. This is especially important if you are taking diabetes medicines or blood thinners (anticoagulants). ? Taking medicines such as aspirin and ibuprofen. These medicines can thin your  blood. Do not take these medicines unless your health care provider tells you to take them. ? Taking over-the-counter medicines, vitamins, herbs, and supplements.  Wear clothing with sleeves that can be raised above the elbow.  Plan to have someone take you home from the hospital or clinic.  You may have a blood sample taken.  Your blood pressure, pulse rate, and breathing rate will be measured. What happens during the procedure?   To lower your risk of infection: ? Your health care team will wash or sanitize their hands. ? Your skin will be cleaned with an antiseptic.  You may be given a medicine to numb the area (local anesthetic).  A tourniquet will be placed on your arm.  A needle will be inserted into one of your veins.  Tubing and a collection bag will be attached to that needle.  Blood will flow through the needle and tubing into the collection bag.  The collection bag will be placed lower than your arm to allow gravity to help the flow of blood into the bag.  You may be asked to open and close your hand slowly and continually during the entire collection.  After the specified amount of blood has been removed from your body, the collection bag and tubing will be clamped.  The needle will be removed from your vein.  Pressure will be held on the site of the needle insertion to stop the bleeding.  A bandage (dressing) will be placed over the needle insertion site. The procedure may vary among health care providers and hospitals. What happens after the procedure?  Your blood pressure, pulse rate, and breathing rate will be   measured after the procedure.  You will be encouraged to drink fluids.  Your recovery will be assessed and monitored.  You can return to your normal activities as told by your health care provider. Summary  Therapeutic phlebotomy is the planned removal of blood from a person's body for the purpose of treating a medical condition.  Therapeutic  phlebotomy may be used to treat hemochromatosis, polycythemia vera, porphyria cutanea tarda, or sickle cell disease.  In the procedure, a needle is inserted and about a pint (470 mL, or 0.47 L) of blood is removed. The average adult has 9-12 pints (4.3-5.7 L) of blood in the body.  This is generally a safe procedure, but it can sometimes cause problems such as nausea, light-headedness, or low blood pressure (hypotension). This information is not intended to replace advice given to you by your health care provider. Make sure you discuss any questions you have with your health care provider. Document Revised: 11/20/2017 Document Reviewed: 11/20/2017 Elsevier Patient Education  2020 [employer]  

## 2020-02-03 ENCOUNTER — Ambulatory Visit: Payer: Medicare Other | Admitting: Hematology and Oncology

## 2020-02-03 ENCOUNTER — Other Ambulatory Visit: Payer: Medicare Other

## 2020-02-10 ENCOUNTER — Other Ambulatory Visit: Payer: Self-pay | Admitting: Hematology and Oncology

## 2020-02-10 ENCOUNTER — Inpatient Hospital Stay: Payer: 59

## 2020-02-10 ENCOUNTER — Other Ambulatory Visit: Payer: Self-pay

## 2020-02-10 ENCOUNTER — Inpatient Hospital Stay (HOSPITAL_BASED_OUTPATIENT_CLINIC_OR_DEPARTMENT_OTHER): Payer: 59 | Admitting: Hematology and Oncology

## 2020-02-10 LAB — FERRITIN: Ferritin: 45 ng/mL (ref 24–336)

## 2020-02-10 LAB — CBC WITH DIFFERENTIAL (CANCER CENTER ONLY)
Abs Immature Granulocytes: 0.01 10*3/uL (ref 0.00–0.07)
Basophils Absolute: 0.1 10*3/uL (ref 0.0–0.1)
Basophils Relative: 1 %
Eosinophils Absolute: 0.1 10*3/uL (ref 0.0–0.5)
Eosinophils Relative: 2 %
HCT: 40.6 % (ref 39.0–52.0)
Hemoglobin: 14.7 g/dL (ref 13.0–17.0)
Immature Granulocytes: 0 %
Lymphocytes Relative: 39 %
Lymphs Abs: 2.5 10*3/uL (ref 0.7–4.0)
MCH: 34.3 pg — ABNORMAL HIGH (ref 26.0–34.0)
MCHC: 36.2 g/dL — ABNORMAL HIGH (ref 30.0–36.0)
MCV: 94.6 fL (ref 80.0–100.0)
Monocytes Absolute: 0.4 10*3/uL (ref 0.1–1.0)
Monocytes Relative: 6 %
Neutro Abs: 3.3 10*3/uL (ref 1.7–7.7)
Neutrophils Relative %: 52 %
Platelet Count: 209 10*3/uL (ref 150–400)
RBC: 4.29 MIL/uL (ref 4.22–5.81)
RDW: 12.7 % (ref 11.5–15.5)
WBC Count: 6.4 10*3/uL (ref 4.0–10.5)
nRBC: 0 % (ref 0.0–0.2)

## 2020-02-10 LAB — CMP (CANCER CENTER ONLY)
ALT: 22 U/L (ref 0–44)
AST: 19 U/L (ref 15–41)
Albumin: 4.2 g/dL (ref 3.5–5.0)
Alkaline Phosphatase: 99 U/L (ref 38–126)
Anion gap: 8 (ref 5–15)
BUN: 15 mg/dL (ref 8–23)
CO2: 26 mmol/L (ref 22–32)
Calcium: 9 mg/dL (ref 8.9–10.3)
Chloride: 106 mmol/L (ref 98–111)
Creatinine: 0.88 mg/dL (ref 0.61–1.24)
GFR, Est AFR Am: >60 mL/min (ref >60)
GFR, Estimated: >60 mL/min (ref >60)
Glucose, Bld: 100 mg/dL — ABNORMAL HIGH (ref 70–99)
Potassium: 4.2 mmol/L (ref 3.5–5.1)
Sodium: 140 mmol/L (ref 135–145)
Total Bilirubin: 0.7 mg/dL (ref 0.3–1.2)
Total Protein: 6.8 g/dL (ref 6.5–8.1)

## 2020-02-10 LAB — IRON AND TIBC
Iron: 160 ug/dL (ref 42–163)
Saturation Ratios: 65 % — ABNORMAL HIGH (ref 20–55)
TIBC: 246 ug/dL (ref 202–409)
UIBC: 87 ug/dL — ABNORMAL LOW (ref 117–376)

## 2020-02-10 NOTE — Progress Notes (Signed)
[facility] [phone]   [phone]  PROGRESS NOTE  Patient Care Team: Maury Dus, MD as PCP - General (Family Medicine)  Hematological/Oncological History # Hereditary Hemachromatosis (Homozygous C282Y) 1) 09/13/2019: Transferrin 189, Ferritin 117.4. PCP referred to hematology for further evaluation and management.  2) 10/05/2019: establish care with Dr. Lorenso Courier  3) 10/26/2019: Phlebotomy #1 4) 12/10/2019: Phlebotomy #2 5) 12/23/2019: Phlebotomy #3 6) 01/14/2020: Phlebotomy #4 7) 01/27/2020: Phlebotomy #5 8) 02/10/2020: Phlebotomy #6 and f/u visit.   Interval History:  Stuart Gaines 64 y.o. male with medical history significant for hereditary hemachromatosis presents for a follow up visit. The patient's last visit was on 12/05/2018. In the interim since the last visit he had a phlebotomies #2 through #5, with the next one scheduled for today.   On exam today today he reports that he is still wearing the boot on his foot and that this will be in place for another 6 to 8 months.  He notes that he continues to have pulsing in his ears, however this is resolved for approximately 2 days after he received phlebotomy.  He notes that during that time he has decrease in chest pressure and improvement in his vision.  The patient notes that unfortunately the symptoms reoccur a short timeframe after each phlebotomy.  He notes he has tolerated phlebotomies 1 through 5 well without any lightheadedness, dizziness, or fatigue.  He has no recent issues with weight loss, abdominal pain, or fevers chills or sweats.  A full 10 point ROS is listed below.  Furthermore today we discussed the utility of the phlebotomies and scheduling moving forward.  We continue to agree on every 2 week phlebotomies with frequent blood checks to assure we are not dropping his levels too low.  The patient was in agreement with this plan. Additionally he noted he wanted to set a lower threshold of  ferritin <30.   MEDICAL HISTORY:  Past Medical History:  Diagnosis Date  . High cholesterol   . Hypertension     SURGICAL HISTORY: Past Surgical History:  Procedure Laterality Date  . BACK SURGERY    . CHOLECYSTECTOMY      SOCIAL HISTORY: Social History   Socioeconomic History  . Marital status: Married    Spouse name: Not on file  . Number of children: 2  . Years of education: Not on file  . Highest education level: Not on file  Occupational History  . Not on file  Tobacco Use  . Smoking status: Former Smoker    Packs/day: 1.00    Years: 0.50    Pack years: 0.50    Types: Cigarettes    Quit date: 1975    Years since quitting: 46.2  Substance and Sexual Activity  . Alcohol use: No  . Drug use: Not on file  . Sexual activity: Not on file  Other Topics Concern  . Not on file  Social History Narrative  . Not on file   Social Determinants of Health   Financial Resource Strain:   . Difficulty of Paying Living Expenses:   Food Insecurity:   . Worried About Charity fundraiser in the Last Year:   . Arboriculturist in the Last Year:   Transportation Needs:   . Film/video editor (Medical):   [name] Lack of Transportation (Non-Medical):   Physical Activity:   . Days of Exercise per Week:   . Minutes of Exercise per Session:   Stress:   .  Feeling of Stress :   Social Connections:   . Frequency of Communication with Friends and Family:   . Frequency of Social Gatherings with Friends and Family:   . Attends Religious Services:   . Active Member of Clubs or Organizations:   . Attends Archivist Meetings:   [name] Marital Status:   Intimate Partner Violence:   . Fear of Current or Ex-Partner:   . Emotionally Abused:   [name] Physically Abused:   . Sexually Abused:     FAMILY HISTORY: Family History  Problem Relation Age of Onset  . Anemia Mother   . Stroke Father   . Anemia Father   . Diabetes Brother   . Stroke Paternal Grandmother     ALLERGIES:   is allergic to ampicillin.  MEDICATIONS:  Current Outpatient Medications  Medication Sig Dispense Refill  . atorvastatin (LIPITOR) 40 MG tablet Take 40 mg by mouth at bedtime.    [name] atorvastatin (LIPITOR) 80 MG tablet Take 40 mg by mouth daily.    . diclofenac (VOLTAREN) 75 MG EC tablet Take 75 mg by mouth daily.    [name] HYDROcodone-acetaminophen (NORCO) 7.5-325 MG per tablet Take 1 tablet by mouth 2 (two) times daily as needed (for pain).     [name] HYDROcodone-acetaminophen (NORCO/VICODIN) 5-325 MG tablet hydrocodone 5 mg-acetaminophen 325 mg tablet  TAKE 1 TABLET BY MOUTH EVERY 4 HOURS AS NEEDED FOR 5 DAYS    . ibuprofen (ADVIL,MOTRIN) 800 MG tablet Take 1 tablet (800 mg total) by mouth every 8 (eight) hours as needed. 21 tablet 0  . lisinopril (PRINIVIL,ZESTRIL) 20 MG tablet Take 20 mg by mouth daily.    [name] lisinopril (ZESTRIL) 5 MG tablet lisinopril 5 mg tablet  TAKE 1 TABLET BY MOUTH IN THE MORNING    . lisinopril (ZESTRIL) 5 MG tablet Take 5 mg by mouth every morning.    [name] omeprazole (PRILOSEC) 20 MG capsule Take 20 mg by mouth daily.    . VENTOLIN HFA 108 (90 BASE) MCG/ACT inhaler Inhale 1-2 puffs into the lungs as needed.      No current facility-administered medications for this visit.    REVIEW OF SYSTEMS:   Constitutional: ( - ) fevers, ( - )  chills , ( - ) night sweats Eyes: ( - ) blurriness of vision, ( - ) double vision, ( - ) watery eyes Ears, nose, mouth, throat, and face: ( - ) mucositis, ( - ) sore throat Respiratory: ( - ) cough, ( - ) dyspnea, ( - ) wheezes Cardiovascular: ( - ) palpitation, ( + ) chest discomfort, ( - ) lower extremity swelling Gastrointestinal:  ( - ) nausea, ( - ) heartburn, ( - ) change in bowel habits Skin: ( - ) abnormal skin rashes Lymphatics: ( - ) new lymphadenopathy, ( - ) easy bruising Neurological: ( - ) numbness, ( - ) tingling, ( - ) new weaknesses Behavioral/Psych: ( - ) mood change, ( - ) new changes  All other systems were reviewed with  the patient and are negative.  PHYSICAL EXAMINATION: ECOG PERFORMANCE STATUS: 1 - Symptomatic but completely ambulatory  Vitals:   02/10/20 1438  BP: (!) 158/83  Pulse: 82  Resp: 17  Temp: 98.5 F (36.9 C)  SpO2: 98%   Filed Weights   02/10/20 1438  Weight: 231 lb 3.2 oz (104.9 kg)    GENERAL: well appearing elderly Caucasian male in no distress and comfortable SKIN: skin color, texture, turgor are normal, no  rashes or significant lesions EYES: conjunctiva are pink and non-injected, sclera clear LUNGS: clear to auscultation and percussion with normal breathing effort HEART: regular rate & rhythm and no murmurs and no lower extremity edema Musculoskeletal: no cyanosis of digits and no clubbing. Left foot in large orthopedic boot.  PSYCH: alert & oriented x 3, fluent speech NEURO: no focal motor/sensory deficits  LABORATORY DATA:  I have reviewed the data as listed CBC Latest Ref Rng & Units 02/10/2020 01/27/2020 01/14/2020  WBC 4.0 - 10.5 K/uL 6.4 5.9 6.0  Hemoglobin 13.0 - 17.0 g/dL 14.7 15.0 15.1  Hematocrit 39.0 - 52.0 % 40.6 42.5 42.5  Platelets 150 - 400 K/uL 209 215 202    CMP Latest Ref Rng & Units 12/23/2019 12/06/2019 10/05/2019  Glucose 70 - 99 mg/dL 102(H) 111(H) 98  BUN 8 - 23 mg/dL 12 13 12   Creatinine 0.61 - 1.24 mg/dL 0.89 1.13 0.99  Sodium 135 - 145 mmol/L 139 141 139  Potassium 3.5 - 5.1 mmol/L 4.8 4.8 4.6  Chloride 98 - 111 mmol/L 105 105 103  CO2 22 - 32 mmol/L 28 27 25   Calcium 8.9 - 10.3 mg/dL 9.4 9.0 9.5  Total Protein 6.5 - 8.1 g/dL 7.3 7.2 7.7  Total Bilirubin 0.3 - 1.2 mg/dL 1.0 0.8 0.8  Alkaline Phos 38 - 126 U/L 109 115 154(H)  AST 15 - 41 U/L 25 27 24   ALT 0 - 44 U/L 33 37 32   Iron/TIBC/Ferritin/ %Sat    Component Value Date/Time   IRON 217 (H) 01/14/2020 1202   TIBC 221 01/14/2020 1202   FERRITIN 88 01/27/2020 0833   IRONPCTSAT 98 (H) 01/14/2020 1202     RADIOGRAPHIC STUDIES: None to review.  ASSESSMENT & PLAN PRATHER LINENBERGER  64 y.o. male with medical history significant for hereditary hemachromatosis presents for a follow up visit.  Review of the labs and discussion with the patient his findings are most consistent with a hereditary hemochromatosis secondary to a homozygous C282Y mutation.  As such I do think it would be reasonable to continue moving forward with routine phlebotomies in order to decrease his ferritin level.  We will set an aggressive ferritin goal of ferritin less than 30 (after discussion today we decided to take it from 50 down further to 30).  We will try to assure that we do not rapidly drop his hemoglobin to cause him to have any symptoms of fatigue, lightheadedness or dizziness.  Additional baseline labs have shown that he has a normal TSH and prediabetes.  His ferritin level at last check 2 weeks ago shows ferritin 88 and his other iron labs are within normal limits. Given that his ferritin level is not dangerously high (>1000) we will hold on performing an MRI of the liver at this time, though it is notable he has normal LFTs.  #Hereditary Hemachromatosis (Homozygous C282Y) --repeat CMP and CBC, iron panel and ferritin levels today --continue phlebotomy q 2 weeks starting this week and to continue until ferritin is <30. Patient has completed 5 phlebotomies with an additional one today.  -- Echocardiogram on 12/15/2019 shows good baseline cardiac function --move forward with plan for phlebotomy this today with repeat phlebotomies q 2 weeks. -- RTC for MD visit in 2 months.   No orders of the defined types were placed in this encounter.  All questions were answered. The patient knows to call the clinic with any problems, questions or concerns.  A total of more than 30 minutes  were spent on this encounter and over half of that time was spent on counseling and coordination of care as outlined above.   Ledell Peoples, MD Department of Hematology/Oncology [place] [facility] Phone: [phone] Pager: [phone] Email: [email or web address]  02/10/2020 2:49 PM

## 2020-02-10 NOTE — Progress Notes (Signed)
Stuart Gaines presents today for phlebotomy per MD orders. Phlebotomy procedure started at 1530 and ended at 1535. 521 grams removed. Patient observed for 30 minutes after procedure without any incident. Patient tolerated procedure well. IV needle removed intact.

## 2020-02-10 NOTE — Patient Instructions (Signed)

## 2020-02-14 ENCOUNTER — Telehealth: Payer: Self-pay | Admitting: *Deleted

## 2020-02-14 ENCOUNTER — Telehealth: Payer: Self-pay | Admitting: Hematology and Oncology

## 2020-02-14 NOTE — Telephone Encounter (Signed)
-----   Message from Orson Slick, MD sent at 02/14/2020 12:18 PM EDT ----- Please let Mr. Johnedward Bladen know that we have dropped his ferritin down to 45. Per his request, we can aim lower for a ferritin of 30. We will plan to continue phlebotomies q 2 weeks.  Colan Neptune  ----- Message ----- From: Buel Ream, Lab [facility] [place] Sent: 02/10/2020   2:34 PM EDT To: Orson Slick, MD

## 2020-02-14 NOTE — Telephone Encounter (Signed)
Scheduled per los. Called and spoke with patient. Confirmed appts  

## 2020-02-14 NOTE — Telephone Encounter (Signed)
TCT patient regarding lab results from last week. Spoke with patient and informed him of ferritin of 45. Pt understands that his ferritin goal is a level of 30 and continuing phlebotomies until that goal is reached. He is aware of upcoming appts.  No questions or concerns at this time.

## 2020-02-25 ENCOUNTER — Inpatient Hospital Stay: Payer: 59 | Attending: Hematology and Oncology

## 2020-02-25 ENCOUNTER — Other Ambulatory Visit: Payer: Self-pay

## 2020-02-25 ENCOUNTER — Inpatient Hospital Stay: Payer: 59

## 2020-02-25 LAB — CBC WITH DIFFERENTIAL (CANCER CENTER ONLY)
Abs Immature Granulocytes: 0.01 10*3/uL (ref 0.00–0.07)
Basophils Absolute: 0 10*3/uL (ref 0.0–0.1)
Basophils Relative: 1 %
Eosinophils Absolute: 0.1 10*3/uL (ref 0.0–0.5)
Eosinophils Relative: 2 %
HCT: 40.5 % (ref 39.0–52.0)
Hemoglobin: 14.5 g/dL (ref 13.0–17.0)
Immature Granulocytes: 0 %
Lymphocytes Relative: 43 %
Lymphs Abs: 2.3 10*3/uL (ref 0.7–4.0)
MCH: 34.8 pg — ABNORMAL HIGH (ref 26.0–34.0)
MCHC: 35.8 g/dL (ref 30.0–36.0)
MCV: 97.1 fL (ref 80.0–100.0)
Monocytes Absolute: 0.4 10*3/uL (ref 0.1–1.0)
Monocytes Relative: 7 %
Neutro Abs: 2.6 10*3/uL (ref 1.7–7.7)
Neutrophils Relative %: 47 %
Platelet Count: 203 10*3/uL (ref 150–400)
RBC: 4.17 MIL/uL — ABNORMAL LOW (ref 4.22–5.81)
RDW: 13 % (ref 11.5–15.5)
WBC Count: 5.4 10*3/uL (ref 4.0–10.5)
nRBC: 0 % (ref 0.0–0.2)

## 2020-02-25 LAB — FERRITIN: Ferritin: 36 ng/mL (ref 24–336)

## 2020-02-25 NOTE — Progress Notes (Signed)
Patient present today for phlebotomy per MD order.  A 16 gauge needle was placed in the Right AC and 507 ml of blood was removed.  Needle was removed fully intact. Patient provided beverage and stayed 30 min post observation. Patient discharged in stable condition.

## 2020-02-25 NOTE — Patient Instructions (Signed)

## 2020-03-09 ENCOUNTER — Inpatient Hospital Stay: Payer: 59

## 2020-03-09 ENCOUNTER — Other Ambulatory Visit: Payer: Self-pay

## 2020-03-09 LAB — CBC WITH DIFFERENTIAL (CANCER CENTER ONLY)
Abs Immature Granulocytes: 0.01 10*3/uL (ref 0.00–0.07)
Basophils Absolute: 0.1 10*3/uL (ref 0.0–0.1)
Basophils Relative: 1 %
Eosinophils Absolute: 0.1 10*3/uL (ref 0.0–0.5)
Eosinophils Relative: 2 %
HCT: 42.1 % (ref 39.0–52.0)
Hemoglobin: 14.7 g/dL (ref 13.0–17.0)
Immature Granulocytes: 0 %
Lymphocytes Relative: 42 %
Lymphs Abs: 2.8 10*3/uL (ref 0.7–4.0)
MCH: 33.8 pg (ref 26.0–34.0)
MCHC: 34.9 g/dL (ref 30.0–36.0)
MCV: 96.8 fL (ref 80.0–100.0)
Monocytes Absolute: 0.5 10*3/uL (ref 0.1–1.0)
Monocytes Relative: 7 %
Neutro Abs: 3.3 10*3/uL (ref 1.7–7.7)
Neutrophils Relative %: 48 %
Platelet Count: 214 10*3/uL (ref 150–400)
RBC: 4.35 MIL/uL (ref 4.22–5.81)
RDW: 12.5 % (ref 11.5–15.5)
WBC Count: 6.8 10*3/uL (ref 4.0–10.5)
nRBC: 0 % (ref 0.0–0.2)

## 2020-03-09 LAB — FERRITIN: Ferritin: 22 ng/mL — ABNORMAL LOW (ref 24–336)

## 2020-03-09 NOTE — Patient Instructions (Signed)

## 2020-03-09 NOTE — Progress Notes (Signed)
Phlebotomy started at 0925, with 18G R AC. Pt asked for 'a little bit more' to be taken off today. 524g removed at 0937. Nutrition offered and accepted. Pt. Agreed to wait 20 min. obs time. States he feels fine.

## 2020-03-10 ENCOUNTER — Telehealth: Payer: Self-pay | Admitting: Hematology and Oncology

## 2020-03-10 ENCOUNTER — Telehealth: Payer: Self-pay | Admitting: *Deleted

## 2020-03-10 NOTE — Telephone Encounter (Signed)
TCT patient regarding lab results from 03/09/20.  No answer but was able to leave vm message for pt to call me back 2 [phone] to advise of Ferritin level of 22. Per  Dr. Lorenso Courier, patient does not need further phlebotomies until his Ferritin increases to >30. Future appts have been canceled.  Scheduling message sent for pt to come back in 2-3 months for labs and MD visit.

## 2020-03-10 NOTE — Telephone Encounter (Signed)
-----   Message from Orson Slick, MD sent at 03/10/2020 10:56 AM EDT ----- Please let Mr. Riebel know that we have dropped his ferritin levels to 22. Please let him know we would not perform further phlebotomies until his levels rise >30.   Please cancel his remaining scheduled phlebotomies/labs visit and assure he has f/u set up with [place] in the next 2-3 months.  Thank you.  Ulice Dash  ----- Message ----- From: Buel Ream, Lab In [place] Sent: 03/09/2020   8:47 AM EDT To: Orson Slick, MD

## 2020-03-10 NOTE — Telephone Encounter (Signed)
Received call back from patient.  Spoke with him regarding labs.  Advised that his Ferritin is now 22 and Dr. Lorenso Courier does not want to do any more phlebotomies until Ferritin is >30.  Informed that his upcoming labs/phlebotomies have been canceled and that we will re-evaluate his labs in late June as well as an appt with Dr. Lorenso Courier.  Stuart Gaines voiced understanding. He is aware of his appt in June.

## 2020-03-10 NOTE — Telephone Encounter (Signed)
Scheduled appt per 4/23 sch message - unable to reach pt . Left message with appt date and time   

## 2020-03-23 ENCOUNTER — Other Ambulatory Visit: Payer: Medicare Other

## 2020-04-06 ENCOUNTER — Other Ambulatory Visit: Payer: Medicare Other

## 2020-04-06 ENCOUNTER — Ambulatory Visit: Payer: Medicare Other | Admitting: Hematology and Oncology

## 2020-04-06 ENCOUNTER — Ambulatory Visit: Payer: Medicare Other

## 2020-04-20 ENCOUNTER — Other Ambulatory Visit: Payer: Medicare Other

## 2020-05-09 ENCOUNTER — Other Ambulatory Visit: Payer: Self-pay | Admitting: Hematology and Oncology

## 2020-05-09 NOTE — Progress Notes (Signed)
[facility] [phone]   [phone]  PROGRESS NOTE  Patient Care Team: Maury Dus, MD as PCP - General (Family Medicine)  Hematological/Oncological History # Hereditary Hemachromatosis (Homozygous C282Y) 1) 09/13/2019: Transferrin 189, Ferritin 117.4. PCP referred to hematology for further evaluation and management.  2) 10/05/2019: establish care with Dr. Lorenso Courier  3) 10/26/2019: Phlebotomy #1 4) 12/10/2019: Phlebotomy #2. Ferritin 249 (12/06/19)  5) 12/23/2019: Phlebotomy #3. Ferritin 145 6) 01/14/2020: Phlebotomy #4. Ferritin 117 7) 01/27/2020: Phlebotomy #5. Ferritin 88 8) 02/10/2020: Phlebotomy #6 and f/u visit. Ferritin 36.  9) 03/09/2020: Phlebotomy #7. Ferritin 22, held further phlebotomy.   Interval History:  [name] 64 y.o. male with medical history significant for hereditary hemachromatosis presents for a follow up visit. The patient's last visit was on 02/10/2019. In the interim since the last visit he reached his goal ferritin of <30 (22) on 4/22/201.   On exam today today he reports that he continues to have the throbbing headaches that wake him up from sleep.  He notes that they came back approximately 2 weeks after his last visit.  He always gets some temporary relief from phlebotomies from these symptoms.  He notes that his energy levels have been good since his last visit and he has not been having any issues with nausea, vomiting, or diarrhea.  His appetite has been stable with no recent changes in weight.  Of note the patient was found to have a blood pressure of 138/81 today and he reports that he checks his blood pressure at home for his primary care provider and typically has blood pressures with systolics in the 720N to 470J.  He denies having any chest pain or shortness of breath.  A full 10 point ROS is listed below.  Furthermore today we discussed the utility of the phlebotomies and scheduling moving forward.  We continue to  agree on every 2 week phlebotomies with frequent blood checks to assure we are not dropping his levels too low.  The patient was in agreement with this plan. Additionally he noted he wanted to set a lower threshold of ferritin <30.   MEDICAL HISTORY:  Past Medical History:  Diagnosis Date  . High cholesterol   . Hypertension     SURGICAL HISTORY: Past Surgical History:  Procedure Laterality Date  . BACK SURGERY    . CHOLECYSTECTOMY      SOCIAL HISTORY: Social History   Socioeconomic History  . Marital status: Married    Spouse name: Not on file  . Number of children: 2  . Years of education: Not on file  . Highest education level: Not on file  Occupational History  . Not on file  Tobacco Use  . Smoking status: Former Smoker    Packs/day: 1.00    Years: 0.50    Pack years: 0.50    Types: Cigarettes    Quit date: 1975    Years since quitting: 46.5  Substance and Sexual Activity  . Alcohol use: No  . Drug use: Not on file  . Sexual activity: Not on file  Other Topics Concern  . Not on file  Social History Narrative  . Not on file   Social Determinants of Health   Financial Resource Strain:   . Difficulty of Paying Living Expenses:   Food Insecurity:   . Worried About Charity fundraiser in the Last Year:   . Arboriculturist in the Last Year:   Transportation Needs:   .  Lack of Transportation (Medical):   [name] Lack of Transportation (Non-Medical):   Physical Activity:   . Days of Exercise per Week:   . Minutes of Exercise per Session:   Stress:   . Feeling of Stress :   Social Connections:   . Frequency of Communication with Friends and Family:   . Frequency of Social Gatherings with Friends and Family:   . Attends Religious Services:   . Active Member of Clubs or Organizations:   . Attends Archivist Meetings:   [name] Marital Status:   Intimate Partner Violence:   . Fear of Current or Ex-Partner:   . Emotionally Abused:   [name] Physically Abused:   .  Sexually Abused:     FAMILY HISTORY: Family History  Problem Relation Age of Onset  . Anemia Mother   . Stroke Father   . Anemia Father   . Diabetes Brother   . Stroke Paternal Grandmother     ALLERGIES:  is allergic to ampicillin.  MEDICATIONS:  Current Outpatient Medications  Medication Sig Dispense Refill  . lisinopril (ZESTRIL) 20 MG tablet Take 20 mg by mouth daily.    [name] atorvastatin (LIPITOR) 40 MG tablet Take 40 mg by mouth at bedtime.    . diclofenac (VOLTAREN) 75 MG EC tablet Take 75 mg by mouth daily.    [name] HYDROcodone-acetaminophen (NORCO) 7.5-325 MG per tablet Take 1 tablet by mouth 2 (two) times daily as needed (for pain).     [name] omeprazole (PRILOSEC) 20 MG capsule Take 20 mg by mouth daily.    . VENTOLIN HFA 108 (90 BASE) MCG/ACT inhaler Inhale 1-2 puffs into the lungs as needed.      No current facility-administered medications for this visit.    REVIEW OF SYSTEMS:   Constitutional: ( - ) fevers, ( - )  chills , ( - ) night sweats Eyes: ( - ) blurriness of vision, ( - ) double vision, ( - ) watery eyes Ears, nose, mouth, throat, and face: ( - ) mucositis, ( - ) sore throat Respiratory: ( - ) cough, ( - ) dyspnea, ( - ) wheezes Cardiovascular: ( - ) palpitation, ( - ) chest discomfort, ( - ) lower extremity swelling Gastrointestinal:  ( - ) nausea, ( - ) heartburn, ( - ) change in bowel habits Skin: ( - ) abnormal skin rashes Lymphatics: ( - ) new lymphadenopathy, ( - ) easy bruising Neurological: ( - ) numbness, ( - ) tingling, ( - ) new weaknesses Behavioral/Psych: ( - ) mood change, ( - ) new changes  All other systems were reviewed with the patient and are negative.  PHYSICAL EXAMINATION: ECOG PERFORMANCE STATUS: 1 - Symptomatic but completely ambulatory  Vitals:   05/10/20 1044  BP: 138/81  Pulse: 80  Resp: 17  Temp: 97.7 F (36.5 C)  SpO2: 97%   Filed Weights   05/10/20 1044  Weight: 229 lb 8 oz (104.1 kg)    GENERAL: well appearing elderly  Caucasian male in no distress and comfortable SKIN: skin color, texture, turgor are normal, no rashes or significant lesions EYES: conjunctiva are pink and non-injected, sclera clear LUNGS: clear to auscultation and percussion with normal breathing effort HEART: regular rate & rhythm and no murmurs and no lower extremity edema Musculoskeletal: no cyanosis of digits and no clubbing. Left foot in large orthopedic boot.  PSYCH: alert & oriented x 3, fluent speech NEURO: no focal motor/sensory deficits  LABORATORY DATA:  I have reviewed  the data as listed CBC Latest Ref Rng & Units 05/10/2020 03/09/2020 02/25/2020  WBC 4.0 - 10.5 K/uL 5.9 6.8 5.4  Hemoglobin 13.0 - 17.0 g/dL 15.7 14.7 14.5  Hematocrit 39 - 52 % 44.7 42.1 40.5  Platelets 150 - 400 K/uL 216 214 203    CMP Latest Ref Rng & Units 05/10/2020 02/10/2020 12/23/2019  Glucose 70 - 99 mg/dL 106(H) 100(H) 102(H)  BUN 8 - 23 mg/dL 14 15 12   Creatinine 0.61 - 1.24 mg/dL 0.96 0.88 0.89  Sodium 135 - 145 mmol/L 140 140 139  Potassium 3.5 - 5.1 mmol/L 5.2(H) 4.2 4.8  Chloride 98 - 111 mmol/L 106 106 105  CO2 22 - 32 mmol/L 26 26 28   Calcium 8.9 - 10.3 mg/dL 9.5 9.0 9.4  Total Protein 6.5 - 8.1 g/dL 7.2 6.8 7.3  Total Bilirubin 0.3 - 1.2 mg/dL 0.7 0.7 1.0  Alkaline Phos 38 - 126 U/L 118 99 109  AST 15 - 41 U/L 17 19 25   ALT 0 - 44 U/L 24 22 33   Iron/TIBC/Ferritin/ %Sat    Component Value Date/Time   IRON 122 05/10/2020 1016   TIBC 283 05/10/2020 1016   FERRITIN 15 (L) 05/10/2020 1016   IRONPCTSAT 43 05/10/2020 1016     RADIOGRAPHIC STUDIES: None to review.  ASSESSMENT & PLAN Stuart Gaines 64 y.o. male with medical history significant for hereditary hemachromatosis presents for a follow up visit.  Review of the labs and discussion with the patient his findings are most consistent with a hereditary hemochromatosis secondary to a homozygous C282Y mutation.  As such I do think it would be reasonable to continue moving forward with  routine phlebotomies in order to decrease his ferritin level.  We will set an aggressive ferritin goal of ferritin less than 30 (after discussion we decided to take it from 50 down further to 30).  We will try to assure that we do not rapidly drop his hemoglobin to cause him to have any symptoms of fatigue, lightheadedness or dizziness.  On exam today Mr. Goody continues to have headaches despite adequate control of his hemochromatosis.  He is not currently having any issues with shortness of breath, chest pain and his energy levels are good.  We will plan to have him return in approximately 6 weeks time for lab visit and 3 months time for another clinic visit.  We can start phlebotomies if he is found to have a ferritin greater than 30 at either of those 2 checks.  #Hereditary Hemachromatosis (Homozygous C282Y) --repeat CMP and CBC, iron panel and ferritin levels today --plan to restart phlebotomy q 2 weeks if ferritin is found to be >30. Patient has completed 7 phlebotomies and has been on hold since reaching goal on 4/22/201.  -- Echocardiogram on 12/15/2019 shows good baseline cardiac function --given the patient's pounding headaches will discuss his care with neurology to see if a consult would be worthwhile.  -- RTC for MD visit in 3 months with interval 6 week ferritin check if patient is at goal.   No orders of the defined types were placed in this encounter.  All questions were answered. The patient knows to call the clinic with any problems, questions or concerns.  A total of more than 25 minutes were spent on this encounter and over half of that time was spent on counseling and coordination of care as outlined above.   Ledell Peoples, MD Department of Hematology/Oncology [facility] at  [facility] Phone: [phone] Pager: [phone] Email: [email or web address]  05/11/2020 12:11 PM

## 2020-05-10 ENCOUNTER — Inpatient Hospital Stay: Payer: 59

## 2020-05-10 ENCOUNTER — Other Ambulatory Visit: Payer: Self-pay

## 2020-05-10 ENCOUNTER — Telehealth: Payer: Self-pay | Admitting: *Deleted

## 2020-05-10 ENCOUNTER — Inpatient Hospital Stay: Payer: 59 | Attending: Hematology and Oncology | Admitting: Hematology and Oncology

## 2020-05-10 DIAGNOSIS — I10 Essential (primary) hypertension: Secondary | ICD-10-CM | POA: Insufficient documentation

## 2020-05-10 DIAGNOSIS — R519 Headache, unspecified: Secondary | ICD-10-CM | POA: Diagnosis not present

## 2020-05-10 DIAGNOSIS — Z87891 Personal history of nicotine dependence: Secondary | ICD-10-CM | POA: Insufficient documentation

## 2020-05-10 DIAGNOSIS — Z79899 Other long term (current) drug therapy: Secondary | ICD-10-CM | POA: Diagnosis not present

## 2020-05-10 LAB — CMP (CANCER CENTER ONLY)
ALT: 24 U/L (ref 0–44)
AST: 17 U/L (ref 15–41)
Albumin: 4.2 g/dL (ref 3.5–5.0)
Alkaline Phosphatase: 118 U/L (ref 38–126)
Anion gap: 8 (ref 5–15)
BUN: 14 mg/dL (ref 8–23)
CO2: 26 mmol/L (ref 22–32)
Calcium: 9.5 mg/dL (ref 8.9–10.3)
Chloride: 106 mmol/L (ref 98–111)
Creatinine: 0.96 mg/dL (ref 0.61–1.24)
GFR, Est AFR Am: >60 mL/min (ref >60)
GFR, Estimated: >60 mL/min (ref >60)
Glucose, Bld: 106 mg/dL — ABNORMAL HIGH (ref 70–99)
Potassium: 5.2 mmol/L — ABNORMAL HIGH (ref 3.5–5.1)
Sodium: 140 mmol/L (ref 135–145)
Total Bilirubin: 0.7 mg/dL (ref 0.3–1.2)
Total Protein: 7.2 g/dL (ref 6.5–8.1)

## 2020-05-10 LAB — RETIC PANEL
Immature Retic Fract: 12.9 % (ref 2.3–15.9)
RBC.: 4.83 MIL/uL (ref 4.22–5.81)
Retic Count, Absolute: 63.3 10*3/uL (ref 19.0–186.0)
Retic Ct Pct: 1.3 % (ref 0.4–3.1)
Reticulocyte Hemoglobin: 37.3 pg (ref >27.9)

## 2020-05-10 LAB — CBC WITH DIFFERENTIAL (CANCER CENTER ONLY)
Abs Immature Granulocytes: 0.01 10*3/uL (ref 0.00–0.07)
Basophils Absolute: 0.1 10*3/uL (ref 0.0–0.1)
Basophils Relative: 1 %
Eosinophils Absolute: 0.1 10*3/uL (ref 0.0–0.5)
Eosinophils Relative: 2 %
HCT: 44.7 % (ref 39.0–52.0)
Hemoglobin: 15.7 g/dL (ref 13.0–17.0)
Immature Granulocytes: 0 %
Lymphocytes Relative: 39 %
Lymphs Abs: 2.3 10*3/uL (ref 0.7–4.0)
MCH: 32.6 pg (ref 26.0–34.0)
MCHC: 35.1 g/dL (ref 30.0–36.0)
MCV: 92.7 fL (ref 80.0–100.0)
Monocytes Absolute: 0.4 10*3/uL (ref 0.1–1.0)
Monocytes Relative: 6 %
Neutro Abs: 3.1 10*3/uL (ref 1.7–7.7)
Neutrophils Relative %: 52 %
Platelet Count: 216 10*3/uL (ref 150–400)
RBC: 4.82 MIL/uL (ref 4.22–5.81)
RDW: 11.7 % (ref 11.5–15.5)
WBC Count: 5.9 10*3/uL (ref 4.0–10.5)
nRBC: 0 % (ref 0.0–0.2)

## 2020-05-10 LAB — FERRITIN: Ferritin: 15 ng/mL — ABNORMAL LOW (ref 24–336)

## 2020-05-10 LAB — LACTATE DEHYDROGENASE: LDH: 182 U/L (ref 98–192)

## 2020-05-10 LAB — IRON AND TIBC
Iron: 122 ug/dL (ref 42–163)
Saturation Ratios: 43 % (ref 20–55)
TIBC: 283 ug/dL (ref 202–409)
UIBC: 161 ug/dL (ref 117–376)

## 2020-05-10 NOTE — Telephone Encounter (Signed)
-----   Message from Orson Slick, MD sent at 05/10/2020 12:43 PM EDT ----- Please let Mr. Weathington know that his ferritin is 15. There is no current indication for phlebotomy. We will plan to see him back for labs in about 6 weeks with f/u clinic visit in 3 months.   I will discuss his care with neurology (I told him this) to see if they have any recommendations.  Colan Neptune  ----- Message ----- From: Buel Ream, Lab In [place] Sent: 05/10/2020  10:41 AM EDT To: Orson Slick, MD

## 2020-05-10 NOTE — Telephone Encounter (Signed)
Attempted call to patient regarding lab results from today. No answer buty was able to leave a vm message on an identified # for pt to call

## 2020-05-10 NOTE — Telephone Encounter (Signed)
Attempted call to patient. No answer but was able to leave vm message for pt to call back at his earliest convenience @ [phone] regarding today's lab results.

## 2020-05-11 ENCOUNTER — Encounter: Payer: Self-pay | Admitting: Hematology and Oncology

## 2020-05-12 NOTE — Telephone Encounter (Signed)
Received call back from patient and he left vm message to return his.  Attempted call back this morning. No answer but was able to leave message for pt to call back before 4pm today.

## 2020-05-12 NOTE — Telephone Encounter (Signed)
Received call back from patient. Advised that his lab results are within normal limits and that he does not need a phlebotomy. Informed him that Dr. Lorenso Courier arranged for him to see Dr. Mickeal Skinner to evaluate his headaches and the pounding in his ears and head.  Pt voiced understanding. The appt with Dr. Mickeal Skinner is on 05/23/20 @ 9am. Pt states he has received notification of this appt.

## 2020-05-23 ENCOUNTER — Inpatient Hospital Stay: Payer: 59 | Attending: Hematology and Oncology | Admitting: Internal Medicine

## 2020-05-23 ENCOUNTER — Ambulatory Visit: Payer: Medicare Other | Admitting: Internal Medicine

## 2020-05-23 ENCOUNTER — Other Ambulatory Visit: Payer: Self-pay

## 2020-05-23 VITALS — BP 153/81 | HR 83 | Temp 98.1°F | Resp 18 | Ht 70.0 in | Wt 222.0 lb

## 2020-05-23 DIAGNOSIS — R519 Headache, unspecified: Secondary | ICD-10-CM | POA: Diagnosis not present

## 2020-05-23 DIAGNOSIS — G441 Vascular headache, not elsewhere classified: Secondary | ICD-10-CM

## 2020-05-23 DIAGNOSIS — Z79899 Other long term (current) drug therapy: Secondary | ICD-10-CM | POA: Insufficient documentation

## 2020-05-23 DIAGNOSIS — Z8249 Family history of ischemic heart disease and other diseases of the circulatory system: Secondary | ICD-10-CM | POA: Insufficient documentation

## 2020-05-23 DIAGNOSIS — Z87891 Personal history of nicotine dependence: Secondary | ICD-10-CM | POA: Insufficient documentation

## 2020-05-23 DIAGNOSIS — G4489 Other headache syndrome: Secondary | ICD-10-CM | POA: Insufficient documentation

## 2020-05-23 DIAGNOSIS — I10 Essential (primary) hypertension: Secondary | ICD-10-CM | POA: Insufficient documentation

## 2020-05-23 NOTE — Progress Notes (Signed)
[facility] [place], [place] [address] [phone]   New Patient Evaluation  Date of Service: 05/23/20 Patient Name: Stuart Gaines Patient MRN: [id number] Patient DOB: [birth year] Provider: Ventura Sellers, MD  Identifying Statement:  JOSHAU CODE is a 64 y.o. male with Family history of deep venous thrombosis [Z82.49] who presents for initial consultation and evaluation regarding hematologic associated neurologic deficits.    Referring Provider: Maury Dus, MD [address],  [place] [address]  Primary Diagnosis:  Hereditary Hemachromatosis (Homozygous C282Y) 1) 09/13/2019: Transferrin 189, Ferritin 117.4. PCP referred to hematology for further evaluation and management.  2) 10/05/2019: establish care with Dr. Lorenso Courier 3) 10/26/2019: Phlebotomy #1 4) 12/10/2019: Phlebotomy #2. Ferritin 249 (12/06/19)  5) 12/23/2019: Phlebotomy #3. Ferritin 145 6) 01/14/2020: Phlebotomy #4. Ferritin 117 7) 01/27/2020: Phlebotomy #5. Ferritin 88 8) 02/10/2020: Phlebotomy #6 and f/u visit. Ferritin 36.  9) 03/09/2020: Phlebotomy #7. Ferritin 22, held further phlebotomy.   History of Present Illness: The patient's records from the referring physician were obtained and reviewed and the patient interviewed to confirm this HPI.  JAHEIM CANINO presents today to review headache syndrome.  He describes holocranial 8/10 throbbing pain associated with photophobia and phonophobia.  Headache onset was 2-3 years ago, frequency is daily, except for the 4-5 days following phlebotomy intervention for hemochromatosis.  Following this 5 day period, headache will recur.  Pain is associated with rhythmic "whooshing" associated with his pulse rate.  It also causes general blurriness of vision.  This visual impairment clears up as well following phlebotomy.  There is clear nocturnal/AM predilection for the headache compared to later in the day.   He continues to take oxycodone daily for chronic back and foot pain, as he has done for 30 years.  Of note, he had required phlebotomy in for hemochromatosis in the early 90's, but there was no headache syndrome associated at this time.  Per patient, has had two family members (uncles) pass away from "clots in the brain".    Medications: Current Outpatient Medications on File Prior to Visit  Medication Sig Dispense Refill  . atorvastatin (LIPITOR) 40 MG tablet Take 40 mg by mouth at bedtime.    . diclofenac (VOLTAREN) 75 MG EC tablet Take 75 mg by mouth daily.    [name] HYDROcodone-acetaminophen (NORCO) 7.5-325 MG per tablet Take 1 tablet by mouth 2 (two) times daily as needed (for pain).     [name] lisinopril (ZESTRIL) 20 MG tablet Take 20 mg by mouth daily.    [name] omeprazole (PRILOSEC) 20 MG capsule Take 20 mg by mouth daily.    . VENTOLIN HFA 108 (90 BASE) MCG/ACT inhaler Inhale 1-2 puffs into the lungs as needed.      No current facility-administered medications on file prior to visit.    Allergies:  Allergies  Allergen Reactions  . Ampicillin Anxiety   Past Medical History:  Past Medical History:  Diagnosis Date  . High cholesterol   . Hypertension    Past Surgical History:  Past Surgical History:  Procedure Laterality Date  . BACK SURGERY    . CHOLECYSTECTOMY     Social History:  Social History   Socioeconomic History  . Marital status: Married    Spouse name: Not on file  . Number of children: 2  . Years of education: Not on file  . Highest education level: Not on file  Occupational History  . Not  on file  Tobacco Use  . Smoking status: Former Smoker    Packs/day: 1.00    Years: 0.50    Pack years: 0.50    Types: Cigarettes    Quit date: 1975    Years since quitting: 46.5  Substance and Sexual Activity  . Alcohol use: No  . Drug use: Not on file  . Sexual activity: Not on file  Other Topics Concern  . Not on file  Social History Narrative  . Not on file   Social  Determinants of Health   Financial Resource Strain:   . Difficulty of Paying Living Expenses:   Food Insecurity:   . Worried About Charity fundraiser in the Last Year:   . Arboriculturist in the Last Year:   Transportation Needs:   . Film/video editor (Medical):   [name] Lack of Transportation (Non-Medical):   Physical Activity:   . Days of Exercise per Week:   . Minutes of Exercise per Session:   Stress:   . Feeling of Stress :   Social Connections:   . Frequency of Communication with Friends and Family:   . Frequency of Social Gatherings with Friends and Family:   . Attends Religious Services:   . Active Member of Clubs or Organizations:   . Attends Archivist Meetings:   [name] Marital Status:   Intimate Partner Violence:   . Fear of Current or Ex-Partner:   . Emotionally Abused:   [name] Physically Abused:   . Sexually Abused:    Family History:  Family History  Problem Relation Age of Onset  . Anemia Mother   . Stroke Father   . Anemia Father   . Diabetes Brother   . Stroke Paternal Grandmother     Review of Systems: Constitutional: Doesn't report fevers, chills or abnormal weight loss Eyes: Doesn't report blurriness of vision Ears, nose, mouth, throat, and face: Doesn't report sore throat Respiratory: Doesn't report cough, dyspnea or wheezes Cardiovascular: Doesn't report palpitation, chest discomfort  Gastrointestinal:  Doesn't report nausea, constipation, diarrhea GU: Doesn't report incontinence Skin: Doesn't report skin rashes Neurological: Per HPI Musculoskeletal: Doesn't report joint pain Behavioral/Psych: Doesn't report anxiety  Physical Exam: Vitals:   05/23/20 0906  BP: (!) 153/81  Pulse: 83  Resp: 18  Temp: 98.1 F (36.7 C)  SpO2: 97%   KPS: 100. General: Alert, cooperative, pleasant, in no acute distress Head: Normal EENT: No conjunctival injection or scleral icterus.  Lungs: Resp effort normal Cardiac: Regular rate Abdomen:  Non-distended abdomen Skin: No rashes cyanosis or petechiae. Extremities: No clubbing or edema  Neurologic Exam: Mental Status: Awake, alert, attentive to examiner. Oriented to self and environment. Language is fluent with intact comprehension.  Cranial Nerves: Visual acuity is grossly normal. Visual fields are full. Extra-ocular movements intact. No ptosis. Face is symmetric Motor: Tone and bulk are normal. Power is full in both arms and legs. Reflexes are symmetric, no pathologic reflexes present.  Sensory: Intact to light touch Gait: Normal.   Labs: I have reviewed the data as listed    Component Value Date/Time   NA 140 05/10/2020 1016   K 5.2 (H) 05/10/2020 1016   CL 106 05/10/2020 1016   CO2 26 05/10/2020 1016   GLUCOSE 106 (H) 05/10/2020 1016   BUN 14 05/10/2020 1016   CREATININE 0.96 05/10/2020 1016   CALCIUM 9.5 05/10/2020 1016   PROT 7.2 05/10/2020 1016   ALBUMIN 4.2 05/10/2020 1016   AST 17  05/10/2020 1016   ALT 24 05/10/2020 1016   ALKPHOS 118 05/10/2020 1016   BILITOT 0.7 05/10/2020 1016   GFRNONAA >60 05/10/2020 1016   GFRAA >60 05/10/2020 1016   Lab Results  Component Value Date   WBC 5.9 05/10/2020   NEUTROABS 3.1 05/10/2020   HGB 15.7 05/10/2020   HCT 44.7 05/10/2020   MCV 92.7 05/10/2020   PLT 216 05/10/2020    Assessment/Plan Chronic Daily Headache Family history of deep venous thrombosis [Z82.49]  OBBIE LEWALLEN presents today with clinical syndrome consist with headache suspected secondary to elevated intracranial pressure.  This is evidenced by his morning predilection, associated visual impairment, clear and consistent improvement with removing plasma volume.    There is notable family history of intracranial thromboses, with significant morbidity involved.  The auditory component of the headache, timed with his pulse rate, does lend suspicion to venous outflow impediment such as CVT or fistula.  Our recommendation at this time is to obtain  dedicated CNS imaging: -MRI brain with and without contrast -MRV of the head to supplement, given history and suspicion per above  We will get his imaging scheduled and follow up closely with him thereafter.   We spent twenty additional minutes teaching regarding the natural history, biology, and historical experience in the treatment of headaches.   We appreciate the opportunity to participate in the care of VIRAAT VANPATTEN.   All questions were answered. The patient knows to call the clinic with any problems, questions or concerns. No barriers to learning were detected.  The total time spent in the encounter was 40 minutes and more than 50% was on counseling and review of test results   Ventura Sellers, MD Medical Director of Neuro-Oncology [facility] [place] 05/23/20 4:29 PM

## 2020-05-25 ENCOUNTER — Telehealth: Payer: Self-pay | Admitting: Internal Medicine

## 2020-05-25 NOTE — Telephone Encounter (Signed)
Scheduled per 7/8 sch message. Pt is aware of appt added on 7/29

## 2020-05-25 NOTE — Telephone Encounter (Signed)
No 7/6 los 

## 2020-05-26 ENCOUNTER — Other Ambulatory Visit: Payer: Self-pay | Admitting: Radiation Therapy

## 2020-06-07 ENCOUNTER — Other Ambulatory Visit: Payer: Self-pay

## 2020-06-07 ENCOUNTER — Ambulatory Visit (HOSPITAL_COMMUNITY)
Admission: RE | Admit: 2020-06-07 | Discharge: 2020-06-07 | Disposition: A | Discharge status: Home / Self Care | Payer: 59 | Source: Ambulatory Visit | Attending: Internal Medicine | Admitting: Internal Medicine

## 2020-06-07 DIAGNOSIS — J3489 Other specified disorders of nose and nasal sinuses: Secondary | ICD-10-CM | POA: Diagnosis not present

## 2020-06-07 DIAGNOSIS — Z8249 Family history of ischemic heart disease and other diseases of the circulatory system: Secondary | ICD-10-CM | POA: Diagnosis present

## 2020-06-07 DIAGNOSIS — G441 Vascular headache, not elsewhere classified: Secondary | ICD-10-CM | POA: Diagnosis present

## 2020-06-07 DIAGNOSIS — R519 Headache, unspecified: Secondary | ICD-10-CM

## 2020-06-07 DIAGNOSIS — I6782 Cerebral ischemia: Secondary | ICD-10-CM | POA: Diagnosis not present

## 2020-06-07 MED ORDER — GADOBUTROL 1 MMOL/ML IV SOLN
10.0000 mL | Freq: Once | INTRAVENOUS | Status: AC | PRN
  Administered 2020-06-07: 10 mL via INTRAVENOUS

## 2020-06-15 ENCOUNTER — Inpatient Hospital Stay (HOSPITAL_BASED_OUTPATIENT_CLINIC_OR_DEPARTMENT_OTHER): Payer: 59 | Admitting: Internal Medicine

## 2020-06-15 ENCOUNTER — Other Ambulatory Visit: Payer: Self-pay

## 2020-06-15 VITALS — BP 139/80 | HR 84 | Temp 97.7°F | Resp 18 | Ht 70.0 in | Wt 223.1 lb

## 2020-06-15 DIAGNOSIS — R519 Headache, unspecified: Secondary | ICD-10-CM | POA: Diagnosis not present

## 2020-06-15 MED ORDER — ACETAZOLAMIDE ER 500 MG PO CP12: 500.0000 mg | ORAL_CAPSULE | Freq: Two times a day (BID) | ORAL | 3 refills | Status: DC

## 2020-06-15 NOTE — Progress Notes (Signed)
[facility] [place], [place] [id number] [phone]   Interval Evaluation  Date of Service: 06/15/20 Patient Name: Stuart Gaines Patient MRN: [id number] Patient DOB: 03-24-1956 Provider: Ventura Sellers, MD  Identifying Statement:  Stuart Gaines is a 64 y.o. male with Headache upon awakening [R51.9]  Primary Diagnosis:  Hereditary Hemachromatosis (Homozygous C282Y) 1) 09/13/2019: Transferrin 189, Ferritin 117.4. PCP referred to hematology for further evaluation and management.  2) 10/05/2019: establish care with Dr. Lorenso Courier 3) 10/26/2019: Phlebotomy #1 4) 12/10/2019: Phlebotomy #2. Ferritin 249 (12/06/19)  5) 12/23/2019: Phlebotomy #3. Ferritin 145 6) 01/14/2020: Phlebotomy #4. Ferritin 117 7) 01/27/2020: Phlebotomy #5. Ferritin 88 8) 02/10/2020: Phlebotomy #6 and f/u visit. Ferritin 36.  9) 03/09/2020: Phlebotomy #7. Ferritin 22, held further phlebotomy.   Interval History:  Stuart Gaines presents today for follow up after recent MRI and MRV.  He describes no changes in headache syndrome since prior visit.  Still waking up nightly with fairly intense pain.  Recurrence with straining and associated with blurred vision.  No recent phlebotomy due to low ferretin count.  H+P (05/23/20) Patient presents today to review headache syndrome.  He describes holocranial 8/10 throbbing pain associated with photophobia and phonophobia.  Headache onset was 2-3 years ago, frequency is daily, except for the 4-5 days following phlebotomy intervention for hemochromatosis.  Following this 5 day period, headache will recur.  Pain is associated with rhythmic "whooshing" associated with his pulse rate.  It also causes general blurriness of vision.  This visual impairment clears up as well following phlebotomy.  There is clear nocturnal/AM predilection for the headache compared to later in the day.  He continues to take oxycodone daily for chronic back and  foot pain, as he has done for 30 years.  Of note, he had required phlebotomy in for hemochromatosis in the early 90's, but there was no headache syndrome associated at this time.  Per patient, has had two family members (uncles) pass away from "clots in the brain".    Medications: Current Outpatient Medications on File Prior to Visit  Medication Sig Dispense Refill  . atorvastatin (LIPITOR) 40 MG tablet Take 40 mg by mouth at bedtime.    . diclofenac (VOLTAREN) 75 MG EC tablet Take 75 mg by mouth daily.    [name] HYDROcodone-acetaminophen (NORCO) 7.5-325 MG per tablet Take 1 tablet by mouth 2 (two) times daily as needed (for pain).     [name] lisinopril (ZESTRIL) 5 MG tablet Take 5 mg by mouth every morning.    [name] omeprazole (PRILOSEC) 20 MG capsule Take 20 mg by mouth daily.    . VENTOLIN HFA 108 (90 BASE) MCG/ACT inhaler Inhale 1-2 puffs into the lungs as needed.     [name] amLODipine (NORVASC) 5 MG tablet Take 5 mg by mouth every morning.     No current facility-administered medications on file prior to visit.    Allergies:  Allergies  Allergen Reactions  . Ampicillin Anxiety   Past Medical History:  Past Medical History:  Diagnosis Date  . High cholesterol   . Hypertension    Past Surgical History:  Past Surgical History:  Procedure Laterality Date  . BACK SURGERY    . CHOLECYSTECTOMY     Social History:  Social History   Socioeconomic History  . Marital status: Married    Spouse name: Not on file  . Number of children: 2  . Years of education: Not on file  .  Highest education level: Not on file  Occupational History  . Not on file  Tobacco Use  . Smoking status: Former Smoker    Packs/day: 1.00    Years: 0.50    Pack years: 0.50    Types: Cigarettes    Quit date: 1975    Years since quitting: 46.6  Substance and Sexual Activity  . Alcohol use: No  . Drug use: Not on file  . Sexual activity: Not on file  Other Topics Concern  . Not on file  Social History Narrative  .  Not on file   Social Determinants of Health   Financial Resource Strain:   . Difficulty of Paying Living Expenses:   Food Insecurity:   . Worried About Charity fundraiser in the Last Year:   . Arboriculturist in the Last Year:   Transportation Needs:   . Film/video editor (Medical):   [name] Lack of Transportation (Non-Medical):   Physical Activity:   . Days of Exercise per Week:   . Minutes of Exercise per Session:   Stress:   . Feeling of Stress :   Social Connections:   . Frequency of Communication with Friends and Family:   . Frequency of Social Gatherings with Friends and Family:   . Attends Religious Services:   . Active Member of Clubs or Organizations:   . Attends Archivist Meetings:   [name] Marital Status:   Intimate Partner Violence:   . Fear of Current or Ex-Partner:   . Emotionally Abused:   [name] Physically Abused:   . Sexually Abused:    Family History:  Family History  Problem Relation Age of Onset  . Anemia Mother   . Stroke Father   . Anemia Father   . Diabetes Brother   . Stroke Paternal Grandmother     Review of Systems: Constitutional: Doesn't report fevers, chills or abnormal weight loss Eyes: Doesn't report blurriness of vision Ears, nose, mouth, throat, and face: Doesn't report sore throat Respiratory: Doesn't report cough, dyspnea or wheezes Cardiovascular: Doesn't report palpitation, chest discomfort  Gastrointestinal:  Doesn't report nausea, constipation, diarrhea GU: Doesn't report incontinence Skin: Doesn't report skin rashes Neurological: Per HPI Musculoskeletal: Doesn't report joint pain Behavioral/Psych: Doesn't report anxiety  Physical Exam: Vitals:   06/15/20 0857  BP: (!) 139/80  Pulse: 84  Resp: 18  Temp: 97.7 F (36.5 C)  SpO2: 98%   KPS: 100. General: Alert, cooperative, pleasant, in no acute distress Head: Normal EENT: No conjunctival injection or scleral icterus.  Lungs: Resp effort normal Cardiac: Regular  rate Abdomen: Non-distended abdomen Skin: No rashes cyanosis or petechiae. Extremities: No clubbing or edema  Neurologic Exam: Mental Status: Awake, alert, attentive to examiner. Oriented to self and environment. Language is fluent with intact comprehension.  Cranial Nerves: Visual acuity is grossly normal. Visual fields are full. Extra-ocular movements intact. No ptosis. Face is symmetric Motor: Tone and bulk are normal. Power is full in both arms and legs. Reflexes are symmetric, no pathologic reflexes present.  Sensory: Intact to light touch Gait: Normal.   Labs: I have reviewed the data as listed    Component Value Date/Time   NA 140 05/10/2020 1016   K 5.2 (H) 05/10/2020 1016   CL 106 05/10/2020 1016   CO2 26 05/10/2020 1016   GLUCOSE 106 (H) 05/10/2020 1016   BUN 14 05/10/2020 1016   CREATININE 0.96 05/10/2020 1016   CALCIUM 9.5 05/10/2020 1016   PROT 7.2  05/10/2020 1016   ALBUMIN 4.2 05/10/2020 1016   AST 17 05/10/2020 1016   ALT 24 05/10/2020 1016   ALKPHOS 118 05/10/2020 1016   BILITOT 0.7 05/10/2020 1016   GFRNONAA >60 05/10/2020 1016   GFRAA >60 05/10/2020 1016   Lab Results  Component Value Date   WBC 5.9 05/10/2020   NEUTROABS 3.1 05/10/2020   HGB 15.7 05/10/2020   HCT 44.7 05/10/2020   MCV 92.7 05/10/2020   PLT 216 05/10/2020   Imaging: MR BRAIN W WO CONTRAST  Result Date: 06/08/2020 CLINICAL DATA:  Other vascular headache. Headache, acute, normal neuro exam. Additional history provided by scanning technology: Patient reports family history of DVT, headaches when awakening, blurred vision, "hearing pulse" for 8-19 months, 3 falls. EXAM: MRI HEAD WITHOUT AND WITH CONTRAST MRV HEAD WITHOUT AND WITH CONTRAST TECHNIQUE: Multiplanar, multiecho pulse sequences of the brain and surrounding structures were obtained without and with intravenous contrast. Angiographic images of the intracranial venous structures were obtained using MRV technique without and with  intravenous contrast. CONTRAST:  56mL GADAVIST GADOBUTROL 1 MMOL/ML IV SOLN COMPARISON:  Head CT 04/19/2014, brain MRI 07/13/2008 FINDINGS: MRI BRAIN FINDINGS: Brain: Cerebral volume is normal. Mild scattered T2/FLAIR hyperintensity within the cerebral white matter is nonspecific, but consistent with chronic small vessel ischemic disease. These findings have progressed as compared to the MRI of 07/13/2008. There is no acute infarct. No evidence of intracranial mass. No chronic intracranial blood products. No extra-axial fluid collection. No midline shift. No abnormal intracranial enhancement. Vascular: Expected proximal arterial flow voids. Skull and upper cervical spine: No focal marrow lesion. Sinuses/Orbits: Visualized orbits show no acute finding. Minimal ethmoid sinus mucosal thickening. Trace fluid within left mastoid air cells. MRV HEAD FINDINGS: The superior sagittal sinus, internal cerebral veins, vein of Galen, straight sinus, transverse sinuses, sigmoid sinuses and visualized jugular veins are patent. There is no appreciable intracranial venous thrombosis. IMPRESSION: MRI brain: 1. No evidence of acute intracranial abnormality. 2. Mild chronic small vessel ischemic changes within the cerebral white matter, progressed as compared to the prior MRI of 07/13/2008. 3. Minimal ethmoid sinus mucosal thickening. 4. Trace fluid within left mastoid air cells. MRV head: No evidence of intracranial venous thrombosis. Electronically Signed   By: Kellie Simmering DO   On: 06/08/2020 18:10   MR MRV HEAD W WO CONTRAST  Result Date: 06/08/2020 CLINICAL DATA:  Other vascular headache. Headache, acute, normal neuro exam. Additional history provided by scanning technology: Patient reports family history of DVT, headaches when awakening, blurred vision, "hearing pulse" for 8-19 months, 3 falls. EXAM: MRI HEAD WITHOUT AND WITH CONTRAST MRV HEAD WITHOUT AND WITH CONTRAST TECHNIQUE: Multiplanar, multiecho pulse sequences of the  brain and surrounding structures were obtained without and with intravenous contrast. Angiographic images of the intracranial venous structures were obtained using MRV technique without and with intravenous contrast. CONTRAST:  46mL GADAVIST GADOBUTROL 1 MMOL/ML IV SOLN COMPARISON:  Head CT 04/19/2014, brain MRI 07/13/2008 FINDINGS: MRI BRAIN FINDINGS: Brain: Cerebral volume is normal. Mild scattered T2/FLAIR hyperintensity within the cerebral white matter is nonspecific, but consistent with chronic small vessel ischemic disease. These findings have progressed as compared to the MRI of 07/13/2008. There is no acute infarct. No evidence of intracranial mass. No chronic intracranial blood products. No extra-axial fluid collection. No midline shift. No abnormal intracranial enhancement. Vascular: Expected proximal arterial flow voids. Skull and upper cervical spine: No focal marrow lesion. Sinuses/Orbits: Visualized orbits show no acute finding. Minimal ethmoid sinus mucosal thickening. Trace fluid  within left mastoid air cells. MRV HEAD FINDINGS: The superior sagittal sinus, internal cerebral veins, vein of Galen, straight sinus, transverse sinuses, sigmoid sinuses and visualized jugular veins are patent. There is no appreciable intracranial venous thrombosis. IMPRESSION: MRI brain: 1. No evidence of acute intracranial abnormality. 2. Mild chronic small vessel ischemic changes within the cerebral white matter, progressed as compared to the prior MRI of 07/13/2008. 3. Minimal ethmoid sinus mucosal thickening. 4. Trace fluid within left mastoid air cells. MRV head: No evidence of intracranial venous thrombosis. Electronically Signed   By: Kellie Simmering DO   On: 06/08/2020 18:10   Assessment/Plan Chronic Daily Headache Headache upon awakening [R51.9]  Stuart Gaines is clinically un-improved from his headache syndrome.  MRI and MRV were reviewed, they did not demonstrate structural or vascular cause of the  headaches.   Burden of symptoms, including positional/nocturnal predilection, recurrence with straining, and concurrent vision impairment, are still most suggestive of elevated intracranial pressure syndrome such as IIH.    We recommended a trial of acetazolomide 500mg  BID.  If no improvement in 1 month we will schedule a lumbar puncture for opening pressure.   We appreciate the opportunity to participate in the care of Stuart Gaines.  We will give him a call in 1 month to assess response to diamox.   All questions were answered. The patient knows to call the clinic with any problems, questions or concerns. No barriers to learning were detected.  The total time spent in the encounter was 30 minutes and more than 50% was on counseling and review of test results   Ventura Sellers, MD Medical Director of Neuro-Oncology [facility] [place] 06/15/20 4:34 PM

## 2020-06-16 ENCOUNTER — Telehealth: Payer: Self-pay | Admitting: Internal Medicine

## 2020-06-16 NOTE — Telephone Encounter (Signed)
Scheduled per 7/29 los. Unable to contact pt. Left voicemail with appt time and date.

## 2020-06-19 ENCOUNTER — Inpatient Hospital Stay: Payer: 59 | Attending: Hematology and Oncology

## 2020-06-21 ENCOUNTER — Inpatient Hospital Stay: Payer: 59

## 2020-06-21 ENCOUNTER — Other Ambulatory Visit: Payer: Self-pay

## 2020-06-21 LAB — CBC WITH DIFFERENTIAL (CANCER CENTER ONLY)
Abs Immature Granulocytes: 0.02 10*3/uL (ref 0.00–0.07)
Basophils Absolute: 0.1 10*3/uL (ref 0.0–0.1)
Basophils Relative: 1 %
Eosinophils Absolute: 0.2 10*3/uL (ref 0.0–0.5)
Eosinophils Relative: 2 %
HCT: 44.4 % (ref 39.0–52.0)
Hemoglobin: 15.5 g/dL (ref 13.0–17.0)
Immature Granulocytes: 0 %
Lymphocytes Relative: 38 %
Lymphs Abs: 2.4 10*3/uL (ref 0.7–4.0)
MCH: 31.6 pg (ref 26.0–34.0)
MCHC: 34.9 g/dL (ref 30.0–36.0)
MCV: 90.6 fL (ref 80.0–100.0)
Monocytes Absolute: 0.4 10*3/uL (ref 0.1–1.0)
Monocytes Relative: 6 %
Neutro Abs: 3.4 10*3/uL (ref 1.7–7.7)
Neutrophils Relative %: 53 %
Platelet Count: 193 10*3/uL (ref 150–400)
RBC: 4.9 MIL/uL (ref 4.22–5.81)
RDW: 12.5 % (ref 11.5–15.5)
WBC Count: 6.5 10*3/uL (ref 4.0–10.5)
nRBC: 0 % (ref 0.0–0.2)

## 2020-06-21 LAB — FERRITIN: Ferritin: 29 ng/mL (ref 24–336)

## 2020-07-19 ENCOUNTER — Telehealth: Payer: Self-pay | Admitting: Internal Medicine

## 2020-07-20 ENCOUNTER — Inpatient Hospital Stay: Payer: 59 | Attending: Hematology and Oncology | Admitting: Internal Medicine

## 2020-07-20 DIAGNOSIS — R519 Headache, unspecified: Secondary | ICD-10-CM | POA: Diagnosis not present

## 2020-07-20 NOTE — Progress Notes (Signed)
I connected with Stuart Gaines on 07/20/20 at 12:30 PM EDT by telephone visit and verified that I am speaking with the correct person using two identifiers.  I discussed the limitations, risks, security and privacy concerns of performing an evaluation and management service by telemedicine and the availability of in-person appointments. I also discussed with the patient that there may be a patient responsible charge related to this service. The patient expressed understanding and agreed to proceed.  Other persons participating in the visit and their role in the encounter:  n/a  Patient's location:  Home  Provider's location:  Office  Chief Complaint:  Headache upon awakening  History of Present Ilness: Stuart Gaines describe modest improvement in chronic headache symptoms since starting the diamox 500mg  twice per day.  However, he could not ultimately tolerate the medication because of abdominal pain, nausea/vomiting refractory to oral PPI (omeprazole).  In addition, diamox caused increased urinary frequency leading to nocturnal awakenings.  Cost was $75 out of pocket.   Observations: Language and cognition at baseline Assessment and Plan: Patient may discontinue Diamox despite positive impact on headaches.  Could consider either second line therapy with Topiramate, or trial of large volume lumbar puncture for suspected intracranial hypertension.  Follow Up Instructions: Follow up as requested based on above, he will let us know in the coming days/weeks.  Would like to avoid non-essential procedures given COVID surge happening right now.   I discussed the assessment and treatment plan with the patient.  The patient was provided an opportunity to ask questions and all were answered.  The patient agreed with the plan and demonstrated understanding of the instructions.    The patient was advised to call back or seek an in-person evaluation if the symptoms worsen or if the condition fails to improve  as anticipated.  I provided 5-10 minutes of non-face-to-face time during this enocunter.  Ventura Sellers, MD   I provided 15 minutes of non face-to-face telephone visit time during this encounter, and > 50% was spent counseling as documented under my assessment & plan.

## 2020-08-10 ENCOUNTER — Inpatient Hospital Stay: Payer: 59 | Admitting: Hematology and Oncology

## 2020-08-10 ENCOUNTER — Inpatient Hospital Stay: Payer: 59

## 2020-12-22 ENCOUNTER — Telehealth: Payer: Self-pay | Admitting: Hematology and Oncology

## 2020-12-22 NOTE — Telephone Encounter (Signed)
Scheduled appt per 2/4 sch msg - pt is aware of apt date and time

## 2021-01-18 ENCOUNTER — Other Ambulatory Visit: Payer: Self-pay | Admitting: Hematology and Oncology

## 2021-01-18 ENCOUNTER — Encounter: Payer: Self-pay | Admitting: Hematology and Oncology

## 2021-01-18 ENCOUNTER — Other Ambulatory Visit: Payer: Self-pay

## 2021-01-18 ENCOUNTER — Inpatient Hospital Stay: Payer: Medicare Other

## 2021-01-18 ENCOUNTER — Inpatient Hospital Stay: Payer: Medicare Other | Attending: Hematology and Oncology | Admitting: Hematology and Oncology

## 2021-01-18 DIAGNOSIS — R519 Headache, unspecified: Secondary | ICD-10-CM | POA: Diagnosis not present

## 2021-01-18 DIAGNOSIS — Z87891 Personal history of nicotine dependence: Secondary | ICD-10-CM | POA: Insufficient documentation

## 2021-01-18 LAB — CBC WITH DIFFERENTIAL (CANCER CENTER ONLY)
Abs Immature Granulocytes: 0.01 10*3/uL (ref 0.00–0.07)
Basophils Absolute: 0 10*3/uL (ref 0.0–0.1)
Basophils Relative: 1 %
Eosinophils Absolute: 0.1 10*3/uL (ref 0.0–0.5)
Eosinophils Relative: 2 %
HCT: 41.4 % (ref 39.0–52.0)
Hemoglobin: 14.8 g/dL (ref 13.0–17.0)
Immature Granulocytes: 0 %
Lymphocytes Relative: 46 %
Lymphs Abs: 2.8 10*3/uL (ref 0.7–4.0)
MCH: 32.8 pg (ref 26.0–34.0)
MCHC: 35.7 g/dL (ref 30.0–36.0)
MCV: 91.8 fL (ref 80.0–100.0)
Monocytes Absolute: 0.3 10*3/uL (ref 0.1–1.0)
Monocytes Relative: 6 %
Neutro Abs: 2.8 10*3/uL (ref 1.7–7.7)
Neutrophils Relative %: 45 %
Platelet Count: 199 10*3/uL (ref 150–400)
RBC: 4.51 MIL/uL (ref 4.22–5.81)
RDW: 12.5 % (ref 11.5–15.5)
WBC Count: 6.1 10*3/uL (ref 4.0–10.5)
nRBC: 0 % (ref 0.0–0.2)

## 2021-01-18 LAB — CMP (CANCER CENTER ONLY)
ALT: 24 U/L (ref 0–44)
AST: 19 U/L (ref 15–41)
Albumin: 4.2 g/dL (ref 3.5–5.0)
Alkaline Phosphatase: 100 U/L (ref 38–126)
Anion gap: 9 (ref 5–15)
BUN: 15 mg/dL (ref 8–23)
CO2: 22 mmol/L (ref 22–32)
Calcium: 8.9 mg/dL (ref 8.9–10.3)
Chloride: 108 mmol/L (ref 98–111)
Creatinine: 0.89 mg/dL (ref 0.61–1.24)
GFR, Estimated: >60 mL/min (ref >60)
Glucose, Bld: 111 mg/dL — ABNORMAL HIGH (ref 70–99)
Potassium: 4.1 mmol/L (ref 3.5–5.1)
Sodium: 139 mmol/L (ref 135–145)
Total Bilirubin: 0.8 mg/dL (ref 0.3–1.2)
Total Protein: 6.9 g/dL (ref 6.5–8.1)

## 2021-01-18 LAB — IRON AND TIBC
Iron: 224 ug/dL — ABNORMAL HIGH (ref 42–163)
Saturation Ratios: 99 % — ABNORMAL HIGH (ref 20–55)
TIBC: 226 ug/dL (ref 202–409)
UIBC: 2 ug/dL — ABNORMAL LOW (ref 117–376)

## 2021-01-18 LAB — FERRITIN: Ferritin: 64 ng/mL (ref 24–336)

## 2021-01-18 NOTE — Progress Notes (Signed)
[facility] [phone]   Fax:([phone]  PROGRESS NOTE  [name] Team: Maury Dus, MD as PCP - General (Family Medicine)  Hematological/Oncological History # Hereditary Hemachromatosis (Homozygous C282Y) 1) 09/13/2019: Transferrin 189, Ferritin 117.4. PCP referred to hematology for further evaluation and management.  2) 10/05/2019: establish Gaines with Dr. Lorenso Courier  3) 10/26/2019: Phlebotomy #1 4) 12/10/2019: Phlebotomy #2. Ferritin 249 (12/06/19)  5) 12/23/2019: Phlebotomy #3. Ferritin 145 6) 01/14/2020: Phlebotomy #4. Ferritin 117 7) 01/27/2020: Phlebotomy #5. Ferritin 88 8) 02/10/2020: Phlebotomy #6 and f/u visit. Ferritin 36.  9) 03/09/2020: Phlebotomy #7. Ferritin 22, held further phlebotomy.   Interval History:  Stuart Gaines 65 y.o. male with medical history significant for hereditary hemachromatosis presents for a follow up visit. Stuart Gaines's last visit was on 05/10/2020. In Stuart interim since Stuart last visit he was lost to follow up, but did establish with Gaines for his headaches.   On exam today today Stuart Gaines with Gaines who is evaluating him for his headaches.  There is a concern that he has an excessive CSF and he may need a procedure in order to help decrease Stuart volume.  He is still been having headaches in Stuart interim since our last visit.  He reports that he has had no other changes in his medications and is otherwise at his baseline level of health.  He has had no issues with bleeding, bruising, or dark stools.  He has not been having any issues with nausea, vomiting, or diarrhea.  His appetite has been stable with no recent changes in weight. A full 10 point ROS is listed below.  Previously we discussed Stuart utility of Stuart phlebotomies and scheduling moving forward.  We continue to agree on every 2 week phlebotomies with frequent blood checks to assure we are not dropping his levels too low.  Stuart Gaines  was in agreement with this plan. Additionally he noted he was agreeable to set a lower threshold of ferritin <30.   MEDICAL HISTORY:  Past Medical History:  Diagnosis Date  . High cholesterol   . Hypertension     SURGICAL HISTORY: Past Surgical History:  Procedure Laterality Date  . BACK SURGERY    . CHOLECYSTECTOMY      SOCIAL HISTORY: Social History   Socioeconomic History  . Marital status: Married    Spouse name: Not on file  . Number of children: 2  . Years of education: Not on file  . Highest education level: Not on file  Occupational History  . Not on file  Tobacco Use  . Smoking status: Former Smoker    Packs/day: 1.00    Years: 0.50    Pack years: 0.50    Types: Cigarettes    Quit date: 1975    Years since quitting: 47.2  . Smokeless tobacco: Not on file  Substance and Sexual Activity  . Alcohol use: No  . Drug use: Not on file  . Sexual activity: Not on file  Other Topics Concern  . Not on file  Social History Narrative  . Not on file   Social Determinants of Health   Financial Resource Strain: Not on file  Food Insecurity: Not on file  Transportation Needs: Not on file  Physical Activity: Not on file  Stress: Not on file  Social Connections: Not on file  Intimate Partner Violence: Not on file    FAMILY HISTORY: Family History  Problem Relation Age of Onset  .  Anemia Mother   . Stroke Father   . Anemia Father   . Diabetes Brother   . Stroke Paternal Grandmother     ALLERGIES:  is allergic to ampicillin.  MEDICATIONS:  Current Outpatient Medications  Medication Sig Dispense Refill  . amLODipine (NORVASC) 5 MG tablet Take 5 mg by mouth every morning.    [name] atorvastatin (LIPITOR) 40 MG tablet Take 40 mg by mouth at bedtime.    . diclofenac (VOLTAREN) 75 MG EC tablet Take 75 mg by mouth daily.    [name] HYDROcodone-acetaminophen (NORCO) 7.5-325 MG per tablet Take 1 tablet by mouth 2 (two) times daily as needed (for pain).     [name] lisinopril  (ZESTRIL) 5 MG tablet Take 5 mg by mouth every morning.    [name] omeprazole (PRILOSEC) 20 MG capsule Take 20 mg by mouth daily.    . VENTOLIN HFA 108 (90 BASE) MCG/ACT inhaler Inhale 1-2 puffs into Stuart lungs as needed.      No current facility-administered medications for this visit.    REVIEW OF SYSTEMS:   Constitutional: ( - ) fevers, ( - )  chills , ( - ) night sweats Eyes: ( - ) blurriness of vision, ( - ) double vision, ( - ) watery eyes Ears, nose, mouth, throat, and face: ( - ) mucositis, ( - ) sore throat Respiratory: ( - ) cough, ( - ) dyspnea, ( - ) wheezes Cardiovascular: ( - ) palpitation, ( - ) chest discomfort, ( - ) lower extremity swelling Gastrointestinal:  ( - ) nausea, ( - ) heartburn, ( - ) change in bowel habits Skin: ( - ) abnormal skin rashes Lymphatics: ( - ) new lymphadenopathy, ( - ) easy bruising Neurological: ( - ) numbness, ( - ) tingling, ( - ) new weaknesses Behavioral/Psych: ( - ) mood change, ( - ) new changes  All other systems were reviewed with Stuart Gaines and are negative.  PHYSICAL EXAMINATION: ECOG PERFORMANCE STATUS: 1 - Symptomatic but completely ambulatory  Vitals:   01/18/21 0834  BP: (!) 152/79  Pulse: 93  Resp: 13  Temp: 97.9 F (36.6 C)  SpO2: 98%   Filed Weights   01/18/21 0834  Weight: 222 lb 14.4 oz (101.1 kg)    GENERAL: well appearing elderly Caucasian male in no distress and comfortable SKIN: skin color, texture, turgor are normal, no rashes or significant lesions EYES: conjunctiva are pink and non-injected, sclera clear LUNGS: clear to auscultation and percussion with normal breathing effort HEART: regular rate & rhythm and no murmurs and no lower extremity edema Musculoskeletal: no cyanosis of digits and no clubbing. Left foot in large orthopedic boot.  PSYCH: alert & oriented x 3, fluent speech NEURO: no focal motor/sensory deficits  LABORATORY DATA:  I have reviewed Stuart data as listed CBC Latest Ref Rng & Units  01/18/2021 06/21/2020 05/10/2020  WBC 4.0 - 10.5 K/uL 6.1 6.5 5.9  Hemoglobin 13.0 - 17.0 g/dL 14.8 15.5 15.7  Hematocrit 39.0 - 52.0 % 41.4 44.4 44.7  Platelets 150 - 400 K/uL 199 193 216    CMP Latest Ref Rng & Units 01/18/2021 05/10/2020 02/10/2020  Glucose 70 - 99 mg/dL 111(H) 106(H) 100(H)  BUN 8 - 23 mg/dL 15 14 15   Creatinine 0.61 - 1.24 mg/dL 0.89 0.96 0.88  Sodium 135 - 145 mmol/L 139 140 140  Potassium 3.5 - 5.1 mmol/L 4.1 5.2(H) 4.2  Chloride 98 - 111 mmol/L 108 106 106  CO2 22 - 32  mmol/L 22 26 26   Calcium 8.9 - 10.3 mg/dL 8.9 9.5 9.0  Total Protein 6.5 - 8.1 g/dL 6.9 7.2 6.8  Total Bilirubin 0.3 - 1.2 mg/dL 0.8 0.7 0.7  Alkaline Phos 38 - 126 U/L 100 118 99  AST 15 - 41 U/L 19 17 19   ALT 0 - 44 U/L 24 24 22    Iron/TIBC/Ferritin/ %Sat    Component Value Date/Time   IRON 224 (H) 01/18/2021 0817   TIBC 226 01/18/2021 0817   FERRITIN 64 01/18/2021 0817   IRONPCTSAT 99 (H) 01/18/2021 0817     RADIOGRAPHIC STUDIES: None to review.  ASSESSMENT & PLAN JACQUISE RARICK 65 y.o. male with medical history significant for hereditary hemachromatosis presents for a follow up visit.    Review of Stuart labs and discussion with Stuart Gaines his findings are most consistent with a hereditary hemochromatosis secondary to a homozygous C282Y mutation.  As such I do think it would be reasonable to continue moving forward with routine phlebotomies in order to decrease his ferritin level.  We will set an aggressive ferritin goal of ferritin less than 30 (after discussion we decided to take it from 50 down further to 30).  We will try to assure that we do not rapidly drop his hemoglobin to cause him to have any symptoms of fatigue, lightheadedness or dizziness.  On exam today Stuart Gaines is at his baseline level of health.  His ferritin has not been checked in a while we will assess that today determine if he requires additional phlebotomies.  He is currently established Gaines with Dr. Mickeal Skinner who is  helping to manage his headaches which is thought to be secondary to an excess CSF.  We'll plan to see Stuart Gaines back in 3 months time unless he requires further phlebotomy.  #Hereditary Hemachromatosis (Homozygous C282Y) --repeat CMP and CBC, iron panel and ferritin levels today --plan to restart phlebotomy q 2 weeks if ferritin is found to be >30. Gaines has completed 7 phlebotomies and has been on hold since reaching goal on 4/22/201.  -- Echocardiogram on 12/15/2019 shows good baseline cardiac function -- RTC for MD visit in 3 months with interval phlebotomies if needed  #Headaches -evaluated by Gaines, will attempt to reconnect him to Dr. Mickeal Skinner.   No orders of Stuart defined types were placed in this encounter.  All questions were answered. Stuart Gaines knows to call Stuart clinic with any problems, questions or concerns.  A total of more than 30 minutes were spent on this encounter and over half of that time was spent on counseling and coordination of Gaines as outlined above.   Ledell Peoples, MD Department of Hematology/Oncology [place] at [facility] Phone: [phone] Pager: [phone] Email: [email or web address]  01/18/2021 8:25 PM

## 2021-01-19 ENCOUNTER — Telehealth: Payer: Self-pay | Admitting: Hematology and Oncology

## 2021-01-19 NOTE — Telephone Encounter (Signed)
Scheduled per los. Called and left msg. Mailed printout  °

## 2021-01-19 NOTE — Telephone Encounter (Signed)
Scheduled appointment per 03/03 schedule message. Contacted patient, patient is aware. 

## 2021-01-22 ENCOUNTER — Telehealth: Payer: Self-pay | Admitting: *Deleted

## 2021-01-22 NOTE — Telephone Encounter (Signed)
TCT patient regarding lab results. Spoke with patient and advised that his Ferritin is now >50 and Dr. Lorenso Courier recommends resuming phlebotomies every 2 weeks to get I to be <50  Or even <30. Advised that he should be hearing from scheduling this week. Pt voiced understanding.

## 2021-01-22 NOTE — Telephone Encounter (Signed)
-----   Message from Orson Slick, MD sent at 01/19/2021  2:25 PM EST ----- Please let Stuart Gaines know that his Ferritin is now >50. He will require phlebotomy again to bring this down. We will plan for q 2 weeks sessions until he is back <50 (or more aggressively <30)  ----- Message ----- From: Interface, Lab In Marie Sent: 01/18/2021   8:28 AM EST To: Orson Slick, MD

## 2021-02-02 ENCOUNTER — Inpatient Hospital Stay: Payer: Medicare Other

## 2021-02-02 ENCOUNTER — Other Ambulatory Visit: Payer: Self-pay

## 2021-02-02 DIAGNOSIS — R519 Headache, unspecified: Secondary | ICD-10-CM | POA: Diagnosis not present

## 2021-02-02 DIAGNOSIS — Z87891 Personal history of nicotine dependence: Secondary | ICD-10-CM | POA: Diagnosis not present

## 2021-02-02 LAB — CBC WITH DIFFERENTIAL (CANCER CENTER ONLY)
Abs Immature Granulocytes: 0.01 10*3/uL (ref 0.00–0.07)
Basophils Absolute: 0.1 10*3/uL (ref 0.0–0.1)
Basophils Relative: 1 %
Eosinophils Absolute: 0.1 10*3/uL (ref 0.0–0.5)
Eosinophils Relative: 2 %
HCT: 42.5 % (ref 39.0–52.0)
Hemoglobin: 15.1 g/dL (ref 13.0–17.0)
Immature Granulocytes: 0 %
Lymphocytes Relative: 42 %
Lymphs Abs: 2.4 10*3/uL (ref 0.7–4.0)
MCH: 33.1 pg (ref 26.0–34.0)
MCHC: 35.5 g/dL (ref 30.0–36.0)
MCV: 93.2 fL (ref 80.0–100.0)
Monocytes Absolute: 0.3 10*3/uL (ref 0.1–1.0)
Monocytes Relative: 5 %
Neutro Abs: 2.8 10*3/uL (ref 1.7–7.7)
Neutrophils Relative %: 50 %
Platelet Count: 183 10*3/uL (ref 150–400)
RBC: 4.56 MIL/uL (ref 4.22–5.81)
RDW: 12.3 % (ref 11.5–15.5)
WBC Count: 5.8 10*3/uL (ref 4.0–10.5)
nRBC: 0 % (ref 0.0–0.2)

## 2021-02-02 LAB — FERRITIN: Ferritin: 56 ng/mL (ref 24–336)

## 2021-02-02 NOTE — Patient Instructions (Signed)

## 2021-02-02 NOTE — Progress Notes (Signed)
Pt tolerated phlebotomy of 530 g over 10 minutes.

## 2021-02-07 ENCOUNTER — Encounter: Payer: Self-pay | Admitting: Internal Medicine

## 2021-02-08 ENCOUNTER — Telehealth: Payer: Self-pay | Admitting: Internal Medicine

## 2021-02-08 NOTE — Telephone Encounter (Signed)
Scheduled appts per 3/24 sch msg. Called pt, no answer. Left msg with appt date and time.  

## 2021-02-12 ENCOUNTER — Inpatient Hospital Stay (HOSPITAL_BASED_OUTPATIENT_CLINIC_OR_DEPARTMENT_OTHER): Payer: Medicare Other | Admitting: Internal Medicine

## 2021-02-12 DIAGNOSIS — R519 Headache, unspecified: Secondary | ICD-10-CM | POA: Diagnosis not present

## 2021-02-12 DIAGNOSIS — G932 Benign intracranial hypertension: Secondary | ICD-10-CM | POA: Diagnosis not present

## 2021-02-12 MED ORDER — TOPIRAMATE 25 MG PO TABS: 25.0000 mg | ORAL_TABLET | Freq: Every day | ORAL | 1 refills | Status: DC

## 2021-02-12 NOTE — Progress Notes (Signed)
I connected with Stuart Gaines on 02/12/21 at  9:00 AM EDT by telephone visit and verified that I am speaking with the correct person using two identifiers.  I discussed the limitations, risks, security and privacy concerns of performing an evaluation and management service by telemedicine and the availability of in-person appointments. I also discussed with the patient that there may be a patient responsible charge related to this service. The patient expressed understanding and agreed to proceed.  Other persons participating in the visit and their role in the encounter:  n/a  Patient's location:  Home  Provider's location:  Office  Chief Complaint:  Headache upon awakening  History of Present Ilness: Stuart Gaines describes ongoing headaches of prior semiology, worsening in frequency and severity.  They are now occurring daily, still worse in the morning but persisting longer.  No longer helped by phlebotomy as prior.  Continues to have blurry vision, worsening of pain with straining, positional predilection.    Observations: Language and cognition at baseline  Assessment and Plan: Headache upon awakening  We still strongly suspect IIH based on his clinical syndrome, prior positive response to diamox (although side effects limited long term use). We recommended continuing workup with large volume lumbar puncture, assessment for opening pressure in addition to routine studies.  Will start him on Topiramate titration; 25mg  daily x7 days, increasing by 25mg  each week until clinical improvement or dose of 100mg  daily is reached.   Follow Up Instructions:  We will plan in person follow up following LP  I discussed the assessment and treatment plan with the patient.  The patient was provided an opportunity to ask questions and all were answered.  The patient agreed with the plan and demonstrated understanding of the instructions.    The patient was advised to call back or seek an in-person  evaluation if the symptoms worsen or if the condition fails to improve as anticipated.  I provided 5-10 minutes of non-face-to-face time during this enocunter.  Ventura Sellers, MD   I provided 15 minutes of non face-to-face telephone visit time during this encounter, and > 50% was spent counseling as documented under my assessment & plan.

## 2021-02-16 ENCOUNTER — Inpatient Hospital Stay: Payer: Medicare Other

## 2021-02-16 ENCOUNTER — Other Ambulatory Visit: Payer: Self-pay

## 2021-02-16 ENCOUNTER — Inpatient Hospital Stay: Payer: Medicare Other | Attending: Hematology and Oncology

## 2021-02-16 DIAGNOSIS — G4489 Other headache syndrome: Secondary | ICD-10-CM | POA: Insufficient documentation

## 2021-02-16 DIAGNOSIS — Z87891 Personal history of nicotine dependence: Secondary | ICD-10-CM | POA: Diagnosis not present

## 2021-02-16 LAB — CBC WITH DIFFERENTIAL (CANCER CENTER ONLY)
Abs Immature Granulocytes: 0.01 10*3/uL (ref 0.00–0.07)
Basophils Absolute: 0.1 10*3/uL (ref 0.0–0.1)
Basophils Relative: 1 %
Eosinophils Absolute: 0.1 10*3/uL (ref 0.0–0.5)
Eosinophils Relative: 2 %
HCT: 44.5 % (ref 39.0–52.0)
Hemoglobin: 15.7 g/dL (ref 13.0–17.0)
Immature Granulocytes: 0 %
Lymphocytes Relative: 44 %
Lymphs Abs: 2.6 10*3/uL (ref 0.7–4.0)
MCH: 33.1 pg (ref 26.0–34.0)
MCHC: 35.3 g/dL (ref 30.0–36.0)
MCV: 93.7 fL (ref 80.0–100.0)
Monocytes Absolute: 0.4 10*3/uL (ref 0.1–1.0)
Monocytes Relative: 6 %
Neutro Abs: 2.8 10*3/uL (ref 1.7–7.7)
Neutrophils Relative %: 47 %
Platelet Count: 197 10*3/uL (ref 150–400)
RBC: 4.75 MIL/uL (ref 4.22–5.81)
RDW: 12.6 % (ref 11.5–15.5)
WBC Count: 5.9 10*3/uL (ref 4.0–10.5)
nRBC: 0 % (ref 0.0–0.2)

## 2021-02-16 LAB — FERRITIN: Ferritin: 52 ng/mL (ref 24–336)

## 2021-02-16 NOTE — Progress Notes (Signed)
Patient present today for phlebotomy per MD order.  A 16 gauge needle was placed in the Right AC and 525 ml of blood was removed.  Needle was removed fully intact. Patient provided beverage and stayed 30 min post observation. Patient discharged in stable condition.

## 2021-02-19 ENCOUNTER — Encounter: Payer: Self-pay | Admitting: Internal Medicine

## 2021-02-26 ENCOUNTER — Ambulatory Visit (HOSPITAL_COMMUNITY)
Admission: RE | Admit: 2021-02-26 | Discharge: 2021-02-26 | Disposition: A | Discharge status: Home / Self Care | Payer: Medicare Other | Source: Ambulatory Visit | Attending: Internal Medicine | Admitting: Internal Medicine

## 2021-02-26 ENCOUNTER — Other Ambulatory Visit: Payer: Self-pay

## 2021-02-26 DIAGNOSIS — R519 Headache, unspecified: Secondary | ICD-10-CM

## 2021-02-26 DIAGNOSIS — G932 Benign intracranial hypertension: Secondary | ICD-10-CM | POA: Diagnosis not present

## 2021-02-26 LAB — CSF CELL COUNT WITH DIFFERENTIAL
RBC Count, CSF: 33 /mm3 — ABNORMAL HIGH
Tube #: 3
WBC, CSF: 1 /mm3 (ref 0–5)

## 2021-02-26 LAB — PROTEIN, CSF: Total  Protein, CSF: 30 mg/dL (ref 15–45)

## 2021-02-26 LAB — GLUCOSE, CSF: Glucose, CSF: 55 mg/dL (ref 40–70)

## 2021-02-26 MED ORDER — LIDOCAINE HCL (PF) 1 % IJ SOLN
5.0000 mL | Freq: Once | INTRAMUSCULAR | Status: AC
  Administered 2021-02-26: 10 mL via INTRADERMAL

## 2021-02-26 NOTE — Progress Notes (Signed)
Patient ID: Stuart Gaines, male   DOB: 17-Oct-1956, 65 y.o.   MRN: [id number] CLINICAL DATA: [Idiopathic intracranial hypertension.] EXAM: DIAGNOSTIC LUMBAR PUNCTURE UNDER FLUOROSCOPIC GUIDANCE COMPARISON: [None.] FLUOROSCOPY TIME: Fluoroscopy Time: [0 minutes 48 seconds] Radiation Exposure Index (if provided by the fluoroscopic device): []  Number of Acquired Spot Images: [0] PROCEDURE: Informed consent was obtained from the patient prior to the procedure, including potential complications of headache, allergy, and pain. With the patient prone, the lower back was prepped with Betadine. 1% Lidocaine was used for local anesthesia. Lumbar puncture was performed at the [L3-4] level using a [20] gauge needle with return of [clear] CSF with an opening pressure of [less than 10] cm water. [5] ml of CSF were obtained for laboratory studies.  Patient became hypotensive and bradycardic during the examination, which improved with Trendelenburg position.  The patient otherwise tolerated the procedure well and there were no apparent complications.  IMPRESSION: [ 1. Very low opening pressure, measuring less than 10 cm water.  2. Removal of 5 cc of clear CSF.  3. Patient became hypotensive and bradycardic during the examination, which improved with Trendelenburg position.

## 2021-02-26 NOTE — Progress Notes (Signed)
Received phone call from diagnostic xray to assess pt. Upon arrival, pt was diaphoretic and vitals guarded. MD at bedside with pt during procedure. Applied cold wet wash cloth to pt forehead. Pt states that he feels lightheaded. After standing by pt for a few mins, reassessed vitals, they are stable. Pt states that he is feeling much better. MD still at bedside. Advised pt to inform staff should he feel lightheaded/dizzy again. Pt verbalized understanding. See flowsheets for vitals. Procedure completed.

## 2021-02-26 NOTE — Discharge Instructions (Signed)
Lumbar Puncture, Care After This sheet gives you information about how to care for yourself after your procedure. Your health care provider may also give you more specific instructions. If you have problems or questions, contact your health care provider. What can I expect after the procedure? After the procedure, it is common to have:  Mild discomfort or pain at the puncture site.  A mild headache that is relieved with pain medicines. Follow these instructions at home: Activity  Lie down flat or rest for as long as directed by your health care provider.  Return to your normal activities as told by your health care provider. Ask your health care provider what activities are safe for you.  Avoid lifting anything heavier than 10 lb (4.5 kg) for at least 12 hours after the procedure.  Do not drive for 24 hours if you were given a medicine to help you relax (sedative) during your procedure.  Do not drive or use heavy machinery while taking prescription pain medicine.   Puncture site care  Remove or change your bandage (dressing) as told by your health care provider.  Check your puncture area every day for signs of infection. Check for: ? More pain. ? Redness or swelling. ? Fluid or blood leaking from the puncture site. ? Warmth. ? Pus or a bad smell. General instructions  Take over-the-counter and prescription medicines only as told by your health care provider.  Drink enough fluids to keep your urine clear or pale yellow. Your health care provider may recommend drinking caffeine to prevent a headache.  Keep all follow-up visits as told by your health care provider. This is important. Contact a health care provider if:  You have fever or chills.  You have nausea or vomiting.  You have a headache that lasts for more than 2 days or does not get better with medicine. Get help right away if:  You develop any of the following in your  legs: ? Weakness. ? Numbness. ? Tingling.  You are unable to control when you urinate or have a bowel movement (incontinence).  You have signs of infection around your puncture site, such as: ? More pain. ? Redness or swelling. ? Fluid or blood leakage. ? Warmth. ? Pus or a bad smell.  You are dizzy or you feel like you might faint.  You have a severe headache, especially when you sit or stand. Summary  A lumbar puncture is a procedure in which a small needle is inserted into the lower back to remove fluid that surrounds the brain and spinal cord.  After this procedure, it is common to have a headache and pain around the needle insertion area.  Lying flat, staying hydrated, and drinking caffeine can help prevent headaches.  Monitor your needle insertion site for signs of infection, including warmth, fluid, or more pain.  Get help right away if you develop leg weakness, leg numbness, incontinence, or severe headaches. This information is not intended to replace advice given to you by your health care provider. Make sure you discuss any questions you have with your health care provider. Document Revised: 09/14/2020 Document Reviewed: 09/14/2020 Elsevier Patient Education  2021 [employer].   Moderate Conscious Sedation, Adult, Care After This sheet gives you information about how to care for yourself after your procedure. Your health care provider may also give you more specific instructions. If you have problems or questions, contact your health care provider. What can I expect after the procedure? After the procedure, it  is common to have:  Sleepiness for several hours.  Impaired judgment for several hours.  Difficulty with balance.  Vomiting if you eat too soon. Follow these instructions at home: For the time period you were told by your health care provider:   For 24 hours:  Rest.   Do not participate in activities where you could fall or become injured.  Do  not drive or use machinery.  Do not drink alcohol.  Do not take sleeping pills or medicines that cause drowsiness.  Do not make important decisions or sign legal documents.  Do not take care of children on your own.      Eating and drinking  Follow the diet recommended by your health care provider.  Drink enough fluid to keep your urine pale yellow.  If you vomit: ? Drink water, juice, or soup when you can drink without vomiting. ? Make sure you have little or no nausea before eating solid foods.   General instructions  Take over-the-counter and prescription medicines only as told by your health care provider.  Have a responsible adult stay with you for the time you are told. It is important to have someone help care for you until you are awake and alert.  Do not smoke.  Keep all follow-up visits as told by your health care provider. This is important. Contact a health care provider if:  You are still sleepy or having trouble with balance after 24 hours.  You feel light-headed.  You keep feeling nauseous or you keep vomiting.  You develop a rash.  You have a fever.  You have redness or swelling around the IV site. Get help right away if:  You have trouble breathing.  You have new-onset confusion at home. Summary  After the procedure, it is common to feel sleepy, have impaired judgment, or feel nauseous if you eat too soon.  Rest after you get home. Know the things you should not do after the procedure.  Follow the diet recommended by your health care provider and drink enough fluid to keep your urine pale yellow.  Get help right away if you have trouble breathing or new-onset confusion at home. This information is not intended to replace advice given to you by your health care provider. Make sure you discuss any questions you have with your health care provider. Document Revised: 03/03/2020 Document Reviewed: 09/30/2019 Elsevier Patient Education  2021  [employer].

## 2021-02-27 DIAGNOSIS — R519 Headache, unspecified: Secondary | ICD-10-CM | POA: Diagnosis not present

## 2021-02-27 DIAGNOSIS — Z0000 Encounter for general adult medical examination without abnormal findings: Secondary | ICD-10-CM | POA: Diagnosis not present

## 2021-02-27 DIAGNOSIS — E7800 Pure hypercholesterolemia, unspecified: Secondary | ICD-10-CM | POA: Diagnosis not present

## 2021-02-27 DIAGNOSIS — K219 Gastro-esophageal reflux disease without esophagitis: Secondary | ICD-10-CM | POA: Diagnosis not present

## 2021-02-27 DIAGNOSIS — Z77090 Contact with and (suspected) exposure to asbestos: Secondary | ICD-10-CM | POA: Diagnosis not present

## 2021-02-27 DIAGNOSIS — J45909 Unspecified asthma, uncomplicated: Secondary | ICD-10-CM | POA: Diagnosis not present

## 2021-02-27 DIAGNOSIS — G894 Chronic pain syndrome: Secondary | ICD-10-CM | POA: Diagnosis not present

## 2021-02-27 DIAGNOSIS — M545 Low back pain, unspecified: Secondary | ICD-10-CM | POA: Diagnosis not present

## 2021-02-27 DIAGNOSIS — I10 Essential (primary) hypertension: Secondary | ICD-10-CM | POA: Diagnosis not present

## 2021-03-01 LAB — CSF CULTURE W GRAM STAIN
Culture: NO GROWTH
Gram Stain: NONE SEEN

## 2021-03-02 ENCOUNTER — Other Ambulatory Visit: Payer: Self-pay

## 2021-03-02 ENCOUNTER — Inpatient Hospital Stay: Payer: Medicare Other

## 2021-03-02 DIAGNOSIS — Z87891 Personal history of nicotine dependence: Secondary | ICD-10-CM | POA: Diagnosis not present

## 2021-03-02 DIAGNOSIS — G4489 Other headache syndrome: Secondary | ICD-10-CM | POA: Diagnosis not present

## 2021-03-02 LAB — CBC WITH DIFFERENTIAL (CANCER CENTER ONLY)
Abs Immature Granulocytes: 0.01 10*3/uL (ref 0.00–0.07)
Basophils Absolute: 0.1 10*3/uL (ref 0.0–0.1)
Basophils Relative: 1 %
Eosinophils Absolute: 0.2 10*3/uL (ref 0.0–0.5)
Eosinophils Relative: 2 %
HCT: 41.7 % (ref 39.0–52.0)
Hemoglobin: 14.8 g/dL (ref 13.0–17.0)
Immature Granulocytes: 0 %
Lymphocytes Relative: 42 %
Lymphs Abs: 2.6 10*3/uL (ref 0.7–4.0)
MCH: 33.2 pg (ref 26.0–34.0)
MCHC: 35.5 g/dL (ref 30.0–36.0)
MCV: 93.5 fL (ref 80.0–100.0)
Monocytes Absolute: 0.4 10*3/uL (ref 0.1–1.0)
Monocytes Relative: 6 %
Neutro Abs: 3 10*3/uL (ref 1.7–7.7)
Neutrophils Relative %: 49 %
Platelet Count: 192 10*3/uL (ref 150–400)
RBC: 4.46 MIL/uL (ref 4.22–5.81)
RDW: 12.8 % (ref 11.5–15.5)
WBC Count: 6.2 10*3/uL (ref 4.0–10.5)
nRBC: 0 % (ref 0.0–0.2)

## 2021-03-02 LAB — FERRITIN: Ferritin: 25 ng/mL (ref 24–336)

## 2021-03-02 NOTE — Patient Instructions (Signed)

## 2021-03-06 ENCOUNTER — Other Ambulatory Visit: Payer: Self-pay

## 2021-03-06 ENCOUNTER — Inpatient Hospital Stay: Payer: Medicare Other | Admitting: Internal Medicine

## 2021-03-06 VITALS — BP 154/90 | HR 86 | Temp 98.4°F | Resp 18 | Ht 70.0 in | Wt 220.0 lb

## 2021-03-06 DIAGNOSIS — R519 Headache, unspecified: Secondary | ICD-10-CM

## 2021-03-06 DIAGNOSIS — Z87891 Personal history of nicotine dependence: Secondary | ICD-10-CM | POA: Diagnosis not present

## 2021-03-06 DIAGNOSIS — G4489 Other headache syndrome: Secondary | ICD-10-CM | POA: Diagnosis not present

## 2021-03-06 NOTE — Progress Notes (Signed)
[facility] [place], [place] [address] [phone]   Interval Evaluation  Date of Service: 03/06/21 Patient Name: Stuart Gaines Patient MRN: [id number] Patient DOB: Jun 05, 1956 Provider: Ventura Sellers, MD  Identifying Statement:  Stuart Gaines is a 65 y.o. male with Headache upon awakening [R51.9]  Primary Diagnosis:  Hereditary Hemachromatosis (Homozygous C282Y) 1) 09/13/2019: Transferrin 189, Ferritin 117.4. PCP referred to hematology for further evaluation and management.  2) 10/05/2019: establish care with Dr. Lorenso Courier 3) 10/26/2019: Phlebotomy #1 4) 12/10/2019: Phlebotomy #2. Ferritin 249 (12/06/19)  5) 12/23/2019: Phlebotomy #3. Ferritin 145 6) 01/14/2020: Phlebotomy #4. Ferritin 117 7) 01/27/2020: Phlebotomy #5. Ferritin 88 8) 02/10/2020: Phlebotomy #6 and f/u visit. Ferritin 36.  9) 03/09/2020: Phlebotomy #7. Ferritin 22, held further phlebotomy.   Interval History:  Stuart Gaines presents today for follow up after recent lumbar puncture.  He reports complete resolution of headache symptoms, starting just minutes after the removal of CSF.  His sleep has improved dramatically because he is no longer in discomfort.  Although it has only been 1 week, no recurrence of symptoms, including when bearing down or straining.  H+P (05/23/20) Patient presents today to review headache syndrome.  He describes holocranial 8/10 throbbing pain associated with photophobia and phonophobia.  Headache onset was 2-3 years ago, frequency is daily, except for the 4-5 days following phlebotomy intervention for hemochromatosis.  Following this 5 day period, headache will recur.  Pain is associated with rhythmic "whooshing" associated with his pulse rate.  It also causes general blurriness of vision.  This visual impairment clears up as well following phlebotomy.  There is clear nocturnal/AM predilection for the headache compared to later in the day.   He continues to take oxycodone daily for chronic back and foot pain, as he has done for 30 years.  Of note, he had required phlebotomy in for hemochromatosis in the early 90's, but there was no headache syndrome associated at this time.  Per patient, has had two family members (uncles) pass away from "clots in the brain".    Medications: Current Outpatient Medications on File Prior to Visit  Medication Sig Dispense Refill  . amLODipine (NORVASC) 5 MG tablet Take 5 mg by mouth every morning.    [name] atorvastatin (LIPITOR) 40 MG tablet Take 40 mg by mouth at bedtime.    . diclofenac (VOLTAREN) 75 MG EC tablet Take 75 mg by mouth daily.    [name] HYDROcodone-acetaminophen (NORCO) 7.5-325 MG per tablet Take 1 tablet by mouth 2 (two) times daily as needed (for pain).     [name] lisinopril (ZESTRIL) 5 MG tablet Take 5 mg by mouth every morning.    . Multiple Vitamins-Minerals (MULTIVITAMIN GUMMIES ADULT) CHEW See admin instructions.    [name] omeprazole (PRILOSEC) 20 MG capsule Take 20 mg by mouth daily.    . VENTOLIN HFA 108 (90 BASE) MCG/ACT inhaler Inhale 1-2 puffs into the lungs as needed.      No current facility-administered medications on file prior to visit.    Allergies:  Allergies  Allergen Reactions  . Duloxetine Hcl     Other reaction(s): hallucinations  . Lisinopril     Other reaction(s): blurred vision and feeling light-headed  . Oxycodone Hcl     Other reaction(s): anger/tachycardia  . Pregabalin     Other reaction(s): hallucinations  . Tramadol Hcl     Other reaction(s): did not help  . Ampicillin Anxiety   Past  Medical History:  Past Medical History:  Diagnosis Date  . High cholesterol   . Hypertension    Past Surgical History:  Past Surgical History:  Procedure Laterality Date  . BACK SURGERY    . CHOLECYSTECTOMY     Social History:  Social History   Socioeconomic History  . Marital status: Legally Separated    Spouse name: Not on file  . Number of children: 2  . Years of  education: Not on file  . Highest education level: Not on file  Occupational History  . Not on file  Tobacco Use  . Smoking status: Former Smoker    Packs/day: 1.00    Years: 0.50    Pack years: 0.50    Types: Cigarettes    Quit date: 1975    Years since quitting: 47.3  . Smokeless tobacco: Not on file  Substance and Sexual Activity  . Alcohol use: No  . Drug use: Not on file  . Sexual activity: Not on file  Other Topics Concern  . Not on file  Social History Narrative  . Not on file   Social Determinants of Health   Financial Resource Strain: Not on file  Food Insecurity: Not on file  Transportation Needs: Not on file  Physical Activity: Not on file  Stress: Not on file  Social Connections: Not on file  Intimate Partner Violence: Not on file   Family History:  Family History  Problem Relation Age of Onset  . Anemia Mother   . Stroke Father   . Anemia Father   . Diabetes Brother   . Stroke Paternal Grandmother     Review of Systems: Constitutional: Doesn't report fevers, chills or abnormal weight loss Eyes: Doesn't report blurriness of vision Ears, nose, mouth, throat, and face: Doesn't report sore throat Respiratory: Doesn't report cough, dyspnea or wheezes Cardiovascular: Doesn't report palpitation, chest discomfort  Gastrointestinal:  Doesn't report nausea, constipation, diarrhea GU: Doesn't report incontinence Skin: Doesn't report skin rashes Neurological: Per HPI Musculoskeletal: Doesn't report joint pain Behavioral/Psych: Doesn't report anxiety  Physical Exam: Vitals:   03/06/21 1129  BP: (!) 154/90  Pulse: 86  Resp: 18  Temp: 98.4 F (36.9 C)  SpO2: 98%   KPS: 100. General: Alert, cooperative, pleasant, in no acute distress Head: Normal EENT: No conjunctival injection or scleral icterus.  Lungs: Resp effort normal Cardiac: Regular rate Abdomen: Non-distended abdomen Skin: No rashes cyanosis or petechiae. Extremities: No clubbing or  edema  Neurologic Exam: Mental Status: Awake, alert, attentive to examiner. Oriented to self and environment. Language is fluent with intact comprehension.  Cranial Nerves: Visual acuity is grossly normal. Visual fields are full. Extra-ocular movements intact. No ptosis. Face is symmetric Motor: Tone and bulk are normal. Power is full in both arms and legs. Reflexes are symmetric, no pathologic reflexes present.  Sensory: Intact to light touch Gait: Normal.   Labs: I have reviewed the data as listed    Component Value Date/Time   NA 139 01/18/2021 0817   K 4.1 01/18/2021 0817   CL 108 01/18/2021 0817   CO2 22 01/18/2021 0817   GLUCOSE 111 (H) 01/18/2021 0817   BUN 15 01/18/2021 0817   CREATININE 0.89 01/18/2021 0817   CALCIUM 8.9 01/18/2021 0817   PROT 6.9 01/18/2021 0817   ALBUMIN 4.2 01/18/2021 0817   AST 19 01/18/2021 0817   ALT 24 01/18/2021 0817   ALKPHOS 100 01/18/2021 0817   BILITOT 0.8 01/18/2021 0817   GFRNONAA >60 01/18/2021 0814  GFRAA >60 05/10/2020 1016   Lab Results  Component Value Date   WBC 6.2 03/02/2021   NEUTROABS 3.0 03/02/2021   HGB 14.8 03/02/2021   HCT 41.7 03/02/2021   MCV 93.5 03/02/2021   PLT 192 03/02/2021   Imaging: DG FL GUIDED LUMBAR PUNCTURE  Result Date: 02/26/2021 CLINICAL DATA:  Idiopathic intracranial hypertension. EXAM: DIAGNOSTIC LUMBAR PUNCTURE UNDER FLUOROSCOPIC GUIDANCE COMPARISON:  None. FLUOROSCOPY TIME:  Fluoroscopy Time:  0 minutes 48 seconds Radiation Exposure Index (if provided by the fluoroscopic device): Number of Acquired Spot Images: 0 PROCEDURE: Informed consent was obtained from the patient prior to the procedure, including potential complications of headache, allergy, and pain. With the patient prone, the lower back was prepped with Betadine. 1% Lidocaine was used for local anesthesia. Lumbar puncture was performed at the L3-4 level using a 20 gauge needle with return of clear CSF with an opening pressure of less than  10 cm water. 5 ml of CSF were obtained for laboratory studies. Patient became hypotensive and bradycardic during the examination, which improved with Trendelenburg position. The patient otherwise tolerated the procedure well and there were no apparent complications. IMPRESSION: 1. Very low opening pressure, measuring less than 10 cm water. 2. Removal of 5 cc of clear CSF. 3. Patient became hypotensive and bradycardic during the examination, which improved with Trendelenburg position. Electronically Signed   By: Lorin Picket M.D.   On: 02/26/2021 09:49   Assessment/Plan Chronic Daily Headache Headache upon awakening [R51.9]  Stuart Gaines is clinically improved from headache syndrome, which is consistent with possible idiopathic intracranial hypertension.  IR reportedly found very CSF low pressure, not consistent with intracranial hypertension.  But dramatic and timely clinical improvement are suggestive of strong therapeutic effect.  He is not interested in Topamax because of long list of potential side effects.  In any case, headache prophylaxis can be deferred as long as therapeutic benefit persists.  He could also be candidate for greater occipital nerve blockade.   We appreciate the opportunity to participate in the care of Stuart Gaines.  He can return to clinic as needed with recurrence of headache symptoms.  All questions were answered. The patient knows to call the clinic with any problems, questions or concerns. No barriers to learning were detected.  The total time spent in the encounter was 30 minutes and more than 50% was on counseling and review of test results   Ventura Sellers, MD Medical Director of Neuro-Oncology [facility] 03/06/21 3:54 PM

## 2021-04-10 DIAGNOSIS — D485 Neoplasm of uncertain behavior of skin: Secondary | ICD-10-CM | POA: Diagnosis not present

## 2021-04-23 ENCOUNTER — Other Ambulatory Visit: Payer: Self-pay

## 2021-04-23 ENCOUNTER — Inpatient Hospital Stay: Payer: Medicare Other | Attending: Hematology and Oncology | Admitting: Hematology and Oncology

## 2021-04-23 ENCOUNTER — Inpatient Hospital Stay: Payer: Medicare Other

## 2021-04-23 DIAGNOSIS — Z832 Family history of diseases of the blood and blood-forming organs and certain disorders involving the immune mechanism: Secondary | ICD-10-CM | POA: Diagnosis not present

## 2021-04-23 DIAGNOSIS — R519 Headache, unspecified: Secondary | ICD-10-CM | POA: Insufficient documentation

## 2021-04-23 DIAGNOSIS — Z87891 Personal history of nicotine dependence: Secondary | ICD-10-CM | POA: Diagnosis not present

## 2021-04-23 DIAGNOSIS — G932 Benign intracranial hypertension: Secondary | ICD-10-CM | POA: Diagnosis not present

## 2021-04-23 DIAGNOSIS — Z833 Family history of diabetes mellitus: Secondary | ICD-10-CM | POA: Insufficient documentation

## 2021-04-23 DIAGNOSIS — Z79899 Other long term (current) drug therapy: Secondary | ICD-10-CM | POA: Diagnosis not present

## 2021-04-23 DIAGNOSIS — Z823 Family history of stroke: Secondary | ICD-10-CM | POA: Diagnosis not present

## 2021-04-23 DIAGNOSIS — Z880 Allergy status to penicillin: Secondary | ICD-10-CM | POA: Diagnosis not present

## 2021-04-23 DIAGNOSIS — Z888 Allergy status to other drugs, medicaments and biological substances status: Secondary | ICD-10-CM | POA: Diagnosis not present

## 2021-04-23 DIAGNOSIS — Z9049 Acquired absence of other specified parts of digestive tract: Secondary | ICD-10-CM | POA: Insufficient documentation

## 2021-04-23 DIAGNOSIS — Z885 Allergy status to narcotic agent status: Secondary | ICD-10-CM | POA: Diagnosis not present

## 2021-04-23 LAB — CBC WITH DIFFERENTIAL (CANCER CENTER ONLY)
Abs Immature Granulocytes: 0.02 10*3/uL (ref 0.00–0.07)
Basophils Absolute: 0.1 10*3/uL (ref 0.0–0.1)
Basophils Relative: 1 %
Eosinophils Absolute: 0.2 10*3/uL (ref 0.0–0.5)
Eosinophils Relative: 3 %
HCT: 43.6 % (ref 39.0–52.0)
Hemoglobin: 15.5 g/dL (ref 13.0–17.0)
Immature Granulocytes: 0 %
Lymphocytes Relative: 38 %
Lymphs Abs: 3.2 10*3/uL (ref 0.7–4.0)
MCH: 33 pg (ref 26.0–34.0)
MCHC: 35.6 g/dL (ref 30.0–36.0)
MCV: 93 fL (ref 80.0–100.0)
Monocytes Absolute: 0.5 10*3/uL (ref 0.1–1.0)
Monocytes Relative: 6 %
Neutro Abs: 4.4 10*3/uL (ref 1.7–7.7)
Neutrophils Relative %: 52 %
Platelet Count: 223 10*3/uL (ref 150–400)
RBC: 4.69 MIL/uL (ref 4.22–5.81)
RDW: 11.8 % (ref 11.5–15.5)
WBC Count: 8.5 10*3/uL (ref 4.0–10.5)
nRBC: 0 % (ref 0.0–0.2)

## 2021-04-24 LAB — FERRITIN: Ferritin: 21 ng/mL — ABNORMAL LOW (ref 24–336)

## 2021-05-01 ENCOUNTER — Encounter: Payer: Self-pay | Admitting: Hematology and Oncology

## 2021-05-01 ENCOUNTER — Telehealth: Payer: Self-pay | Admitting: Hematology and Oncology

## 2021-05-01 NOTE — Telephone Encounter (Signed)
Scheduled appointment per 06/14 sch msg. Left message.

## 2021-05-01 NOTE — Progress Notes (Signed)
[place] [phone]   [phone]  PROGRESS NOTE  Patient Care Team: Maury Dus, MD as PCP - General (Family Medicine)  Hematological/Oncological History # Hereditary Hemachromatosis (Homozygous C282Y) 1) 09/13/2019: Transferrin 189, Ferritin 117.4. PCP referred to hematology for further evaluation and management.  2) 10/05/2019: establish care with Dr. Lorenso Courier  3) 10/26/2019: Phlebotomy #1 4) 12/10/2019: Phlebotomy #2. Ferritin 249 (12/06/19)  5) 12/23/2019: Phlebotomy #3. Ferritin 145 6) 01/14/2020: Phlebotomy #4. Ferritin 117 7) 01/27/2020: Phlebotomy #5. Ferritin 88 8) 02/10/2020: Phlebotomy #6 and f/u visit. Ferritin 36.  9) 03/09/2020: Phlebotomy #7. Ferritin 22, held further phlebotomy.   Interval History:  Stuart Gaines 65 y.o. male with medical history significant for hereditary hemachromatosis presents for a follow up visit. The patient's last visit was on 01/18/2021. In the interim since the last visit he has had no major changes in his health.   On exam today today Stuart Gaines reports that he has been well in the interim since her last visit.  He reports that the last time he underwent a lumbar puncture he had a marked improvement in his symptoms.  He reports that he slept 3 hours soundly after the procedure which is unusual for him.  He notes that otherwise his energy is fair and his health is stable.  He does have some occasional aches and pains but no new concerning symptoms.  He reports that he has had no other changes in his medications and is otherwise at his baseline level of health.  He has had no issues with bleeding, bruising, or dark stools.  He has not been having any issues with nausea, vomiting, or diarrhea.  His appetite has been stable with no recent changes in weight. A full 10 point ROS is listed below.  Previously we discussed the utility of the phlebotomies and scheduling moving forward.  We continue to agree on every 2 week  phlebotomies with frequent blood checks to assure we are not dropping his levels too low.  The patient was in agreement with this plan. Additionally he noted he was agreeable to set a lower threshold of ferritin <30.   MEDICAL HISTORY:  Past Medical History:  Diagnosis Date   High cholesterol    Hypertension     SURGICAL HISTORY: Past Surgical History:  Procedure Laterality Date   BACK SURGERY     CHOLECYSTECTOMY      SOCIAL HISTORY: Social History   Socioeconomic History   Marital status: Legally Separated    Spouse name: Not on file   Number of children: 2   Years of education: Not on file   Highest education level: Not on file  Occupational History   Not on file  Tobacco Use   Smoking status: Former    Packs/day: 1.00    Years: 0.50    Pack years: 0.50    Types: Cigarettes    Quit date: 36    Years since quitting: 47.4   Smokeless tobacco: Not on file  Substance and Sexual Activity   Alcohol use: No   Drug use: Not on file   Sexual activity: Not on file  Other Topics Concern   Not on file  Social History Narrative   Not on file   Social Determinants of Health   Financial Resource Strain: Not on file  Food Insecurity: Not on file  Transportation Needs: Not on file  Physical Activity: Not on file  Stress: Not on file  Social Connections: Not on file  Intimate Partner Violence: Not on file    FAMILY HISTORY: Family History  Problem Relation Age of Onset   Anemia Mother    Stroke Father    Anemia Father    Diabetes Brother    Stroke Paternal Grandmother     ALLERGIES:  is allergic to duloxetine hcl, lisinopril, oxycodone hcl, pregabalin, tramadol hcl, and ampicillin.  MEDICATIONS:  Current Outpatient Medications  Medication Sig Dispense Refill   amLODipine (NORVASC) 5 MG tablet Take 5 mg by mouth every morning.     atorvastatin (LIPITOR) 40 MG tablet Take 40 mg by mouth at bedtime.     diclofenac (VOLTAREN) 75 MG EC tablet Take 75 mg by mouth  daily.     HYDROcodone-acetaminophen (NORCO) 7.5-325 MG per tablet Take 1 tablet by mouth 2 (two) times daily as needed (for pain).      lisinopril (ZESTRIL) 5 MG tablet Take 5 mg by mouth every morning.     Multiple Vitamins-Minerals (MULTIVITAMIN GUMMIES ADULT) CHEW See admin instructions.     omeprazole (PRILOSEC) 20 MG capsule Take 20 mg by mouth daily.     VENTOLIN HFA 108 (90 BASE) MCG/ACT inhaler Inhale 1-2 puffs into the lungs as needed.      No current facility-administered medications for this visit.    REVIEW OF SYSTEMS:   Constitutional: ( - ) fevers, ( - )  chills , ( - ) night sweats Eyes: ( - ) blurriness of vision, ( - ) double vision, ( - ) watery eyes Ears, nose, mouth, throat, and face: ( - ) mucositis, ( - ) sore throat Respiratory: ( - ) cough, ( - ) dyspnea, ( - ) wheezes Cardiovascular: ( - ) palpitation, ( - ) chest discomfort, ( - ) lower extremity swelling Gastrointestinal:  ( - ) nausea, ( - ) heartburn, ( - ) change in bowel habits Skin: ( - ) abnormal skin rashes Lymphatics: ( - ) new lymphadenopathy, ( - ) easy bruising Neurological: ( - ) numbness, ( - ) tingling, ( - ) new weaknesses Behavioral/Psych: ( - ) mood change, ( - ) new changes  All other systems were reviewed with the patient and are negative.  PHYSICAL EXAMINATION: ECOG PERFORMANCE STATUS: 1 - Symptomatic but completely ambulatory  Vitals:   04/23/21 1538  BP: 127/83  Pulse: 82  Resp: 20  Temp: 98 F (36.7 C)  SpO2: 99%   Filed Weights   04/23/21 1538  Weight: 221 lb (100.2 kg)    GENERAL: well appearing elderly Caucasian male in no distress and comfortable SKIN: skin color, texture, turgor are normal, no rashes or significant lesions EYES: conjunctiva are pink and non-injected, sclera clear LUNGS: clear to auscultation and percussion with normal breathing effort HEART: regular rate & rhythm and no murmurs and no lower extremity edema Musculoskeletal: no cyanosis of digits and  no clubbing. Left foot in large orthopedic boot.  PSYCH: alert & oriented x 3, fluent speech NEURO: no focal motor/sensory deficits  LABORATORY DATA:  I have reviewed the data as listed CBC Latest Ref Rng & Units 04/23/2021 03/02/2021 02/16/2021  WBC 4.0 - 10.5 K/uL 8.5 6.2 5.9  Hemoglobin 13.0 - 17.0 g/dL 15.5 14.8 15.7  Hematocrit 39.0 - 52.0 % 43.6 41.7 44.5  Platelets 150 - 400 K/uL 223 192 197    CMP Latest Ref Rng & Units 01/18/2021 05/10/2020 02/10/2020  Glucose 70 - 99 mg/dL 111(H) 106(H) 100(H)  BUN 8 - 23 mg/dL 15 14 15  Creatinine 0.61 - 1.24 mg/dL 0.89 0.96 0.88  Sodium 135 - 145 mmol/L 139 140 140  Potassium 3.5 - 5.1 mmol/L 4.1 5.2(H) 4.2  Chloride 98 - 111 mmol/L 108 106 106  CO2 22 - 32 mmol/L 22 26 26   Calcium 8.9 - 10.3 mg/dL 8.9 9.5 9.0  Total Protein 6.5 - 8.1 g/dL 6.9 7.2 6.8  Total Bilirubin 0.3 - 1.2 mg/dL 0.8 0.7 0.7  Alkaline Phos 38 - 126 U/L 100 118 99  AST 15 - 41 U/L 19 17 19   ALT 0 - 44 U/L 24 24 22    Iron/TIBC/Ferritin/ %Sat    Component Value Date/Time   IRON 224 (H) 01/18/2021 0817   TIBC 226 01/18/2021 0817   FERRITIN 21 (L) 04/23/2021 1433   IRONPCTSAT 99 (H) 01/18/2021 0817     RADIOGRAPHIC STUDIES: None to review.  ASSESSMENT & PLAN KENZO OZMENT 65 y.o. male with medical history significant for hereditary hemachromatosis presents for a follow up visit.    Review of the labs and discussion with the patient his findings are most consistent with a hereditary hemochromatosis secondary to a homozygous C282Y mutation.  As such I do think it would be reasonable to continue moving forward with routine phlebotomies in order to decrease his ferritin level.  We will set an aggressive ferritin goal of ferritin less than 30 (after discussion we decided to take it from 50 down further to 30).  We will try to assure that we do not rapidly drop his hemoglobin to cause him to have any symptoms of fatigue, lightheadedness or dizziness.  On exam today Mr.  Gaines is at his baseline level of health.  His ferritin has not been checked in a while we will assess that today determine if he requires additional phlebotomies.  He is currently established care with Dr. Mickeal Skinner who is helping to manage his headaches which is thought to be secondary to an excess CSF.  We'll plan to see Stuart Gaines back in 3 months time unless he requires further phlebotomy.   #Hereditary Hemachromatosis (Homozygous C282Y) --repeat CMP and CBC, iron panel and ferritin levels today --plan to restart phlebotomy q 2 weeks if ferritin is found to be >30. Patient has completed 7 phlebotomies and has been on hold since reaching goal on 4/22/201.  --ferritin today at 21, at target.  -- Echocardiogram on 12/15/2019 shows good baseline cardiac function -- RTC for MD visit in 3 months with interval phlebotomies if needed  #Headaches -previously evaluated by neurology, will attempt to reconnect him to Dr. Mickeal Skinner.  --headaches improving.   No orders of the defined types were placed in this encounter.  All questions were answered. The patient knows to call the clinic with any problems, questions or concerns.  A total of more than 30 minutes were spent on this encounter and over half of that time was spent on counseling and coordination of care as outlined above.   Stuart Peoples, MD Department of Hematology/Oncology [place] at [facility] Phone: [phone] Pager: [phone] Email: [email or web address]  05/01/2021 11:55 AM

## 2021-06-30 IMAGING — RF DG SPINAL PUNCT LUMBAR DIAG WITH FL CT GUIDANCE
1 series · 1 of 1 positions shown · non-contrast
Comparison: None.

CLINICAL DATA: Idiopathic intracranial hypertension.

EXAM:
DIAGNOSTIC LUMBAR PUNCTURE UNDER FLUOROSCOPIC GUIDANCE

[Series 1: cp_standard · 0.17mm/px · 1 of 1 slices shown]
[im 1/1]
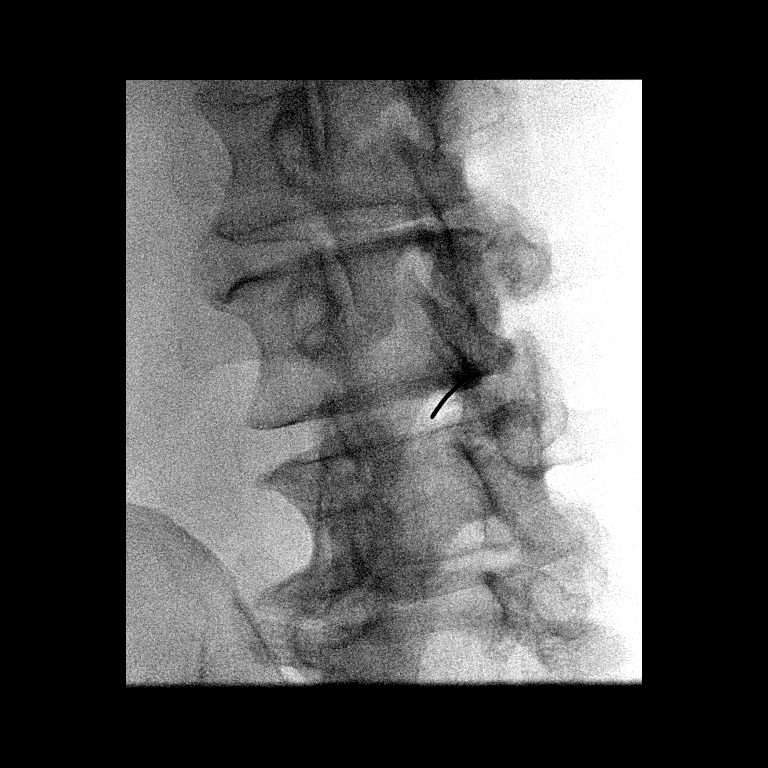

[1 of 1 positions shown; findings below may reference images not displayed]

FLUOROSCOPY TIME:  Fluoroscopy Time:  0 minutes 48 seconds

Radiation Exposure Index (if provided by the fluoroscopic device):

Number of Acquired Spot Images: 0

PROCEDURE:
Informed consent was obtained from the patient prior to the
procedure, including potential complications of headache, allergy,
and pain. With the patient prone, the lower back was prepped with
Betadine. 1% Lidocaine was used for local anesthesia. Lumbar
puncture was performed at the L3-4 level using a 20 gauge needle
with return of clear CSF with an opening pressure of less than 10 cm
water. 5 ml of CSF were obtained for laboratory studies.

Patient became hypotensive and bradycardic during the examination,
which improved with Trendelenburg position. The patient otherwise
tolerated the procedure well and there were no apparent
complications.
IMPRESSION: 1. Very low opening pressure, measuring less than 10 cm water.
2. Removal of 5 cc of clear CSF.
3. Patient became hypotensive and bradycardic during the
examination, which improved with Trendelenburg position.

## 2021-08-01 ENCOUNTER — Inpatient Hospital Stay: Payer: Medicare Other | Admitting: Hematology and Oncology

## 2021-08-01 ENCOUNTER — Other Ambulatory Visit: Payer: Self-pay

## 2021-08-01 ENCOUNTER — Inpatient Hospital Stay: Payer: Medicare Other | Attending: Hematology and Oncology

## 2021-08-01 DIAGNOSIS — G932 Benign intracranial hypertension: Secondary | ICD-10-CM

## 2021-08-01 LAB — CBC WITH DIFFERENTIAL (CANCER CENTER ONLY)
Abs Immature Granulocytes: 0.01 10*3/uL (ref 0.00–0.07)
Basophils Absolute: 0.1 10*3/uL (ref 0.0–0.1)
Basophils Relative: 1 %
Eosinophils Absolute: 0.2 10*3/uL (ref 0.0–0.5)
Eosinophils Relative: 2 %
HCT: 43.5 % (ref 39.0–52.0)
Hemoglobin: 15.5 g/dL (ref 13.0–17.0)
Immature Granulocytes: 0 %
Lymphocytes Relative: 37 %
Lymphs Abs: 2.9 10*3/uL (ref 0.7–4.0)
MCH: 32.5 pg (ref 26.0–34.0)
MCHC: 35.6 g/dL (ref 30.0–36.0)
MCV: 91.2 fL (ref 80.0–100.0)
Monocytes Absolute: 0.5 10*3/uL (ref 0.1–1.0)
Monocytes Relative: 7 %
Neutro Abs: 4.2 10*3/uL (ref 1.7–7.7)
Neutrophils Relative %: 53 %
Platelet Count: 216 10*3/uL (ref 150–400)
RBC: 4.77 MIL/uL (ref 4.22–5.81)
RDW: 12.6 % (ref 11.5–15.5)
WBC Count: 7.8 10*3/uL (ref 4.0–10.5)
nRBC: 0 % (ref 0.0–0.2)

## 2021-08-01 LAB — FERRITIN: Ferritin: 30 ng/mL (ref 24–336)

## 2021-08-01 NOTE — Progress Notes (Signed)
[place] [phone]   [phone]  PROGRESS NOTE  Patient Care Team: Maury Dus, MD as PCP - General (Family Medicine)  Hematological/Oncological History # Hereditary Hemachromatosis (Homozygous C282Y) 1) 09/13/2019: Transferrin 189, Ferritin 117.4. PCP referred to hematology for further evaluation and management.  2) 10/05/2019: establish care with Dr. Lorenso Courier  3) 10/26/2019: Phlebotomy #1 4) 12/10/2019: Phlebotomy #2. Ferritin 249 (12/06/19)  5) 12/23/2019: Phlebotomy #3. Ferritin 145 6) 01/14/2020: Phlebotomy #4. Ferritin 117 7) 01/27/2020: Phlebotomy #5. Ferritin 88 8) 02/10/2020: Phlebotomy #6 and f/u visit. Ferritin 36.  9) 03/09/2020: Phlebotomy #7. Ferritin 22, held further phlebotomy.  10) 02/02/2021: Phlebotomy #8. Ferritin 56 11) 02/16/2021:Phlebotomy #9. Ferritin 52 12) 03/02/2021: Phlebotomy #10. Ferritin 25, held further phlebotomy.  Interval History:  Stuart Gaines 65 y.o. male with medical history significant for hereditary hemachromatosis presents for a follow up visit. The patient's last visit was on 04/23/2021. In the interim since the last visit he has had no major changes in his health.   On exam today today Stuart Gaines reports he has been well in the interim since her last visit.  He notes he is no longer having any headaches and reports that his energy levels are baseline.  He notes he is not taking any new medication. He has had no issues with bleeding, bruising, or dark stools.  He has not been having any issues with nausea, vomiting, or diarrhea.  His appetite has been stable with no recent changes in weight. A full 10 point ROS is listed below.  Previously we discussed the utility of the phlebotomies and scheduling moving forward.  We continue to agree on every 2 week phlebotomies with frequent blood checks to assure we are not dropping his levels too low.  The patient was in agreement with this plan. Additionally he noted he was  agreeable to set a lower threshold of ferritin <30.   MEDICAL HISTORY:  Past Medical History:  Diagnosis Date   High cholesterol    Hypertension     SURGICAL HISTORY: Past Surgical History:  Procedure Laterality Date   BACK SURGERY     CHOLECYSTECTOMY      SOCIAL HISTORY: Social History   Socioeconomic History   Marital status: Legally Separated    Spouse name: Not on file   Number of children: 2   Years of education: Not on file   Highest education level: Not on file  Occupational History   Not on file  Tobacco Use   Smoking status: Former    Packs/day: 1.00    Years: 0.50    Pack years: 0.50    Types: Cigarettes    Quit date: 70    Years since quitting: 47.7   Smokeless tobacco: Not on file  Substance and Sexual Activity   Alcohol use: No   Drug use: Not on file   Sexual activity: Not on file  Other Topics Concern   Not on file  Social History Narrative   Not on file   Social Determinants of Health   Financial Resource Strain: Not on file  Food Insecurity: Not on file  Transportation Needs: Not on file  Physical Activity: Not on file  Stress: Not on file  Social Connections: Not on file  Intimate Partner Violence: Not on file    FAMILY HISTORY: Family History  Problem Relation Age of Onset   Anemia Mother    Stroke Father    Anemia Father    Diabetes Brother  Stroke Paternal Grandmother     ALLERGIES:  is allergic to duloxetine hcl, lisinopril, oxycodone hcl, pregabalin, tramadol hcl, and ampicillin.  MEDICATIONS:  Current Outpatient Medications  Medication Sig Dispense Refill   amLODipine (NORVASC) 5 MG tablet Take 5 mg by mouth every morning.     atorvastatin (LIPITOR) 40 MG tablet Take 40 mg by mouth at bedtime.     diclofenac (VOLTAREN) 75 MG EC tablet Take 75 mg by mouth daily.     HYDROcodone-acetaminophen (NORCO) 7.5-325 MG per tablet Take 1 tablet by mouth 2 (two) times daily as needed (for pain).      lisinopril (ZESTRIL) 5 MG  tablet Take 5 mg by mouth every morning.     Multiple Vitamins-Minerals (MULTIVITAMIN GUMMIES ADULT) CHEW See admin instructions.     omeprazole (PRILOSEC) 20 MG capsule Take 20 mg by mouth daily.     VENTOLIN HFA 108 (90 BASE) MCG/ACT inhaler Inhale 1-2 puffs into the lungs as needed.      No current facility-administered medications for this visit.    REVIEW OF SYSTEMS:   Constitutional: ( - ) fevers, ( - )  chills , ( - ) night sweats Eyes: ( - ) blurriness of vision, ( - ) double vision, ( - ) watery eyes Ears, nose, mouth, throat, and face: ( - ) mucositis, ( - ) sore throat Respiratory: ( - ) cough, ( - ) dyspnea, ( - ) wheezes Cardiovascular: ( - ) palpitation, ( - ) chest discomfort, ( - ) lower extremity swelling Gastrointestinal:  ( - ) nausea, ( - ) heartburn, ( - ) change in bowel habits Skin: ( - ) abnormal skin rashes Lymphatics: ( - ) new lymphadenopathy, ( - ) easy bruising Neurological: ( - ) numbness, ( - ) tingling, ( - ) new weaknesses Behavioral/Psych: ( - ) mood change, ( - ) new changes  All other systems were reviewed with the patient and are negative.  PHYSICAL EXAMINATION: ECOG PERFORMANCE STATUS: 1 - Symptomatic but completely ambulatory  Vitals:   08/01/21 1450  BP: 134/73  Pulse: 83  Resp: 20  Temp: 98.3 F (36.8 C)  SpO2: 97%   Filed Weights   08/01/21 1450  Weight: 227 lb 8 oz (103.2 kg)    GENERAL: well appearing elderly Caucasian male in no distress and comfortable SKIN: skin color, texture, turgor are normal, no rashes or significant lesions EYES: conjunctiva are pink and non-injected, sclera clear LUNGS: clear to auscultation and percussion with normal breathing effort HEART: regular rate & rhythm and no murmurs and no lower extremity edema Musculoskeletal: no cyanosis of digits and no clubbing. Left foot in large orthopedic boot.  PSYCH: alert & oriented x 3, fluent speech NEURO: no focal motor/sensory deficits  LABORATORY DATA:  I  have reviewed the data as listed CBC Latest Ref Rng & Units 08/01/2021 04/23/2021 03/02/2021  WBC 4.0 - 10.5 K/uL 7.8 8.5 6.2  Hemoglobin 13.0 - 17.0 g/dL 15.5 15.5 14.8  Hematocrit 39.0 - 52.0 % 43.5 43.6 41.7  Platelets 150 - 400 K/uL 216 223 192    CMP Latest Ref Rng & Units 01/18/2021 05/10/2020 02/10/2020  Glucose 70 - 99 mg/dL 111(H) 106(H) 100(H)  BUN 8 - 23 mg/dL '15 14 15  '$ Creatinine 0.61 - 1.24 mg/dL 0.89 0.96 0.88  Sodium 135 - 145 mmol/L 139 140 140  Potassium 3.5 - 5.1 mmol/L 4.1 5.2(H) 4.2  Chloride 98 - 111 mmol/L 108 106 106  CO2 22 -  32 mmol/L '22 26 26  '$ Calcium 8.9 - 10.3 mg/dL 8.9 9.5 9.0  Total Protein 6.5 - 8.1 g/dL 6.9 7.2 6.8  Total Bilirubin 0.3 - 1.2 mg/dL 0.8 0.7 0.7  Alkaline Phos 38 - 126 U/L 100 118 99  AST 15 - 41 U/L '19 17 19  '$ ALT 0 - 44 U/L '24 24 22   '$ Iron/TIBC/Ferritin/ %Sat    Component Value Date/Time   IRON 224 (H) 01/18/2021 0817   TIBC 226 01/18/2021 0817   FERRITIN 21 (L) 04/23/2021 1433   IRONPCTSAT 99 (H) 01/18/2021 0817     RADIOGRAPHIC STUDIES: None to review.  ASSESSMENT & PLAN MAURI LEDON 65 y.o. male with medical history significant for hereditary hemachromatosis presents for a follow up visit.    Review of the labs and discussion with the patient his findings are most consistent with a hereditary hemochromatosis secondary to a homozygous C282Y mutation.  As such I do think it would be reasonable to continue moving forward with routine phlebotomies in order to decrease his ferritin level.  We will set an aggressive ferritin goal of ferritin less than 30 (after discussion we decided to take it from 50 down further to 30).  We will try to assure that we do not rapidly drop his hemoglobin to cause him to have any symptoms of fatigue, lightheadedness or dizziness.  On exam today Stuart Gaines is at his baseline level of health.  His ferritin was at goal at last check 3 months ago, rechecking levels today.  He established care with Dr. Mickeal Skinner  who is helping to manage his headaches which is thought to be secondary to an excess CSF.  We'll plan to see Stuart Gaines back in 3 months time unless he requires further phlebotomy.   #Hereditary Hemachromatosis (Homozygous C282Y) --repeat CBC and ferritin levels today --plan to restart phlebotomy q 2 weeks if ferritin is found to be >30. Patient has completed 9 phlebotomies and has been on hold since reaching goal on 03/02/2021.  --ferritin today pending, last at 51 in June 2022, at target.  -- Echocardiogram on 12/15/2019 shows good baseline cardiac function -- RTC for MD visit in 3 months with interval phlebotomies if needed  #Headaches - evaluated by neurology, continue care with Dr. Mickeal Skinner.  --headaches improved  No orders of the defined types were placed in this encounter.  All questions were answered. The patient knows to call the clinic with any problems, questions or concerns.  A total of more than 30 minutes were spent on this encounter and over half of that time was spent on counseling and coordination of care as outlined above.   Ledell Peoples, MD Department of Hematology/Oncology [place] at [facility] Phone: [phone] Pager: [phone] Email: [email or web address]  08/01/2021 3:25 PM

## 2021-08-03 ENCOUNTER — Telehealth: Payer: Self-pay | Admitting: *Deleted

## 2021-08-03 NOTE — Telephone Encounter (Signed)
-----   Message from Orson Slick, MD sent at 08/02/2021  5:08 PM EDT ----- Please let Mr. Stuart Gaines know that his Ferritin is 30. This is at goal. There is no need for phlebotomy at this time. We will continue to monitor his levels and set him up for phlebotomy if it increases to >50.   ----- Message ----- From: Buel Ream, Lab In [place] Sent: 08/01/2021   2:43 PM EDT To: Orson Slick, MD

## 2021-08-03 NOTE — Telephone Encounter (Signed)
TCT patient regarding recent lab results. No answer but was able to leave vm message stating that his Ferritin is at goal @ 30. No need for phlebotomy. Advised that we will see him back here in December.

## 2021-09-04 DIAGNOSIS — Z77090 Contact with and (suspected) exposure to asbestos: Secondary | ICD-10-CM | POA: Diagnosis not present

## 2021-09-04 DIAGNOSIS — K219 Gastro-esophageal reflux disease without esophagitis: Secondary | ICD-10-CM | POA: Diagnosis not present

## 2021-09-04 DIAGNOSIS — G894 Chronic pain syndrome: Secondary | ICD-10-CM | POA: Diagnosis not present

## 2021-09-04 DIAGNOSIS — J45909 Unspecified asthma, uncomplicated: Secondary | ICD-10-CM | POA: Diagnosis not present

## 2021-09-04 DIAGNOSIS — E7800 Pure hypercholesterolemia, unspecified: Secondary | ICD-10-CM | POA: Diagnosis not present

## 2021-09-04 DIAGNOSIS — M545 Low back pain, unspecified: Secondary | ICD-10-CM | POA: Diagnosis not present

## 2021-09-04 DIAGNOSIS — I10 Essential (primary) hypertension: Secondary | ICD-10-CM | POA: Diagnosis not present

## 2021-09-04 DIAGNOSIS — Z23 Encounter for immunization: Secondary | ICD-10-CM | POA: Diagnosis not present

## 2021-10-29 ENCOUNTER — Telehealth: Payer: Self-pay | Admitting: Hematology and Oncology

## 2021-10-29 NOTE — Telephone Encounter (Signed)
Scheduled per sch msg. Called and spoke with patient. Confirmed appt  

## 2021-11-02 ENCOUNTER — Ambulatory Visit: Payer: Medicare Other | Admitting: Hematology and Oncology

## 2021-11-02 ENCOUNTER — Other Ambulatory Visit: Payer: Medicare Other

## 2021-11-28 ENCOUNTER — Inpatient Hospital Stay: Payer: Medicare Other | Admitting: Hematology and Oncology

## 2021-11-28 ENCOUNTER — Other Ambulatory Visit: Payer: Self-pay

## 2021-11-28 ENCOUNTER — Inpatient Hospital Stay: Payer: Medicare Other | Attending: Hematology and Oncology

## 2021-11-28 DIAGNOSIS — G932 Benign intracranial hypertension: Secondary | ICD-10-CM

## 2021-11-28 DIAGNOSIS — Z87891 Personal history of nicotine dependence: Secondary | ICD-10-CM | POA: Insufficient documentation

## 2021-11-28 DIAGNOSIS — R519 Headache, unspecified: Secondary | ICD-10-CM | POA: Insufficient documentation

## 2021-11-28 DIAGNOSIS — Z79899 Other long term (current) drug therapy: Secondary | ICD-10-CM | POA: Diagnosis not present

## 2021-11-28 LAB — FERRITIN: Ferritin: 44 ng/mL (ref 24–336)

## 2021-11-28 LAB — CBC WITH DIFFERENTIAL (CANCER CENTER ONLY)
Abs Immature Granulocytes: 0.01 10*3/uL (ref 0.00–0.07)
Basophils Absolute: 0.1 10*3/uL (ref 0.0–0.1)
Basophils Relative: 1 %
Eosinophils Absolute: 0.2 10*3/uL (ref 0.0–0.5)
Eosinophils Relative: 3 %
HCT: 43.9 % (ref 39.0–52.0)
Hemoglobin: 15.6 g/dL (ref 13.0–17.0)
Immature Granulocytes: 0 %
Lymphocytes Relative: 47 %
Lymphs Abs: 3.4 10*3/uL (ref 0.7–4.0)
MCH: 32.9 pg (ref 26.0–34.0)
MCHC: 35.5 g/dL (ref 30.0–36.0)
MCV: 92.6 fL (ref 80.0–100.0)
Monocytes Absolute: 0.5 10*3/uL (ref 0.1–1.0)
Monocytes Relative: 7 %
Neutro Abs: 3.1 10*3/uL (ref 1.7–7.7)
Neutrophils Relative %: 42 %
Platelet Count: 218 10*3/uL (ref 150–400)
RBC: 4.74 MIL/uL (ref 4.22–5.81)
RDW: 12.4 % (ref 11.5–15.5)
WBC Count: 7.3 10*3/uL (ref 4.0–10.5)
nRBC: 0 % (ref 0.0–0.2)

## 2021-11-28 NOTE — Progress Notes (Signed)
[place] [phone]   [phone]  PROGRESS NOTE  Patient Care Team: Maury Dus, MD as PCP - General (Family Medicine)  Hematological/Oncological History # Hereditary Hemachromatosis (Homozygous C282Y) 1) 09/13/2019: Transferrin 189, Ferritin 117.4. PCP referred to hematology for further evaluation and management.  2) 10/05/2019: establish care with Dr. Lorenso Courier  3) 10/26/2019: Phlebotomy #1 4) 12/10/2019: Phlebotomy #2. Ferritin 249 (12/06/19)  5) 12/23/2019: Phlebotomy #3. Ferritin 145 6) 01/14/2020: Phlebotomy #4. Ferritin 117 7) 01/27/2020: Phlebotomy #5. Ferritin 88 8) 02/10/2020: Phlebotomy #6 and f/u visit. Ferritin 36.  9) 03/09/2020: Phlebotomy #7. Ferritin 22, held further phlebotomy.  10) 02/02/2021: Phlebotomy #8. Ferritin 56 11) 02/16/2021:Phlebotomy #9. Ferritin 52 12) 03/02/2021: Phlebotomy #10. Ferritin 25, held further phlebotomy.  Interval History:  Stuart Gaines 66 y.o. male with medical history significant for hereditary hemachromatosis presents for a follow up visit. The patient's last visit was on 08/01/2021. In the interim since the last visit he has had no major changes in his health.   On exam today today Stuart Gaines reports he has been well overall in the interim since our last visit.  He notes that he has had no major changes in his health.  He does occasionally have issues with headaches but notes that these are few and far between.  He is seeing Dr. Mickeal Skinner on an as-needed basis moving forward.  He notes that everything else has been steady.  His energy levels and weight have not changed.  He is also no longer wearing the large boot that he used to stabilize his foot.  He has had no issues with bleeding, bruising, or dark stools.  He has not been having any issues with nausea, vomiting, or diarrhea.  His appetite has been stable with no recent changes in weight. A full 10 point ROS is listed below.  Previously we discussed the  utility of the phlebotomies and scheduling moving forward.  We continue to agree on every 2 week phlebotomies with frequent blood checks to assure we are not dropping his levels too low.  The patient was in agreement with this plan. Additionally he noted he was agreeable to a standard threshold of ferritin <50.   MEDICAL HISTORY:  Past Medical History:  Diagnosis Date   High cholesterol    Hypertension     SURGICAL HISTORY: Past Surgical History:  Procedure Laterality Date   BACK SURGERY     CHOLECYSTECTOMY      SOCIAL HISTORY: Social History   Socioeconomic History   Marital status: Legally Separated    Spouse name: Not on file   Number of children: 2   Years of education: Not on file   Highest education level: Not on file  Occupational History   Not on file  Tobacco Use   Smoking status: Former    Packs/day: 1.00    Years: 0.50    Pack years: 0.50    Types: Cigarettes    Quit date: 17    Years since quitting: 48.0   Smokeless tobacco: Not on file  Substance and Sexual Activity   Alcohol use: No   Drug use: Not on file   Sexual activity: Not on file  Other Topics Concern   Not on file  Social History Narrative   Not on file   Social Determinants of Health   Financial Resource Strain: Not on file  Food Insecurity: Not on file  Transportation Needs: Not on file  Physical Activity: Not on file  Stress:  Not on file  Social Connections: Not on file  Intimate Partner Violence: Not on file    FAMILY HISTORY: Family History  Problem Relation Age of Onset   Anemia Mother    Stroke Father    Anemia Father    Diabetes Brother    Stroke Paternal Grandmother     ALLERGIES:  is allergic to duloxetine hcl, lisinopril, oxycodone hcl, pregabalin, tramadol hcl, and ampicillin.  MEDICATIONS:  Current Outpatient Medications  Medication Sig Dispense Refill   amLODipine (NORVASC) 5 MG tablet Take 5 mg by mouth every morning.     atorvastatin (LIPITOR) 40 MG tablet  Take 40 mg by mouth at bedtime.     diclofenac (VOLTAREN) 75 MG EC tablet Take 75 mg by mouth daily.     HYDROcodone-acetaminophen (NORCO) 7.5-325 MG per tablet Take 1 tablet by mouth 2 (two) times daily as needed (for pain).      lisinopril (ZESTRIL) 5 MG tablet Take 5 mg by mouth every morning.     Multiple Vitamins-Minerals (MULTIVITAMIN GUMMIES ADULT) CHEW See admin instructions.     omeprazole (PRILOSEC) 20 MG capsule Take 20 mg by mouth daily.     VENTOLIN HFA 108 (90 BASE) MCG/ACT inhaler Inhale 1-2 puffs into the lungs as needed.      No current facility-administered medications for this visit.    REVIEW OF SYSTEMS:   Constitutional: ( - ) fevers, ( - )  chills , ( - ) night sweats Eyes: ( - ) blurriness of vision, ( - ) double vision, ( - ) watery eyes Ears, nose, mouth, throat, and face: ( - ) mucositis, ( - ) sore throat Respiratory: ( - ) cough, ( - ) dyspnea, ( - ) wheezes Cardiovascular: ( - ) palpitation, ( - ) chest discomfort, ( - ) lower extremity swelling Gastrointestinal:  ( - ) nausea, ( - ) heartburn, ( - ) change in bowel habits Skin: ( - ) abnormal skin rashes Lymphatics: ( - ) new lymphadenopathy, ( - ) easy bruising Neurological: ( - ) numbness, ( - ) tingling, ( - ) new weaknesses Behavioral/Psych: ( - ) mood change, ( - ) new changes  All other systems were reviewed with the patient and are negative.  PHYSICAL EXAMINATION: ECOG PERFORMANCE STATUS: 1 - Symptomatic but completely ambulatory  Vitals:   11/28/21 0823  BP: (!) 144/84  Pulse: 77  Resp: 17  Temp: 98.3 F (36.8 C)  SpO2: 95%   Filed Weights   11/28/21 0823  Weight: 225 lb 14.4 oz (102.5 kg)    GENERAL: well appearing elderly Caucasian male in no distress and comfortable SKIN: skin color, texture, turgor are normal, no rashes or significant lesions EYES: conjunctiva are pink and non-injected, sclera clear LUNGS: clear to auscultation and percussion with normal breathing effort HEART:  regular rate & rhythm and no murmurs and no lower extremity edema Musculoskeletal: no cyanosis of digits and no clubbing. Left foot no longer in large orthopedic boot.  PSYCH: alert & oriented x 3, fluent speech NEURO: no focal motor/sensory deficits  LABORATORY DATA:  I have reviewed the data as listed CBC Latest Ref Rng & Units 11/28/2021 08/01/2021 04/23/2021  WBC 4.0 - 10.5 K/uL 7.3 7.8 8.5  Hemoglobin 13.0 - 17.0 g/dL 15.6 15.5 15.5  Hematocrit 39.0 - 52.0 % 43.9 43.5 43.6  Platelets 150 - 400 K/uL 218 216 223    CMP Latest Ref Rng & Units 01/18/2021 05/10/2020 02/10/2020  Glucose 70 -  99 mg/dL 111(H) 106(H) 100(H)  BUN 8 - 23 mg/dL 15 14 15   Creatinine 0.61 - 1.24 mg/dL 0.89 0.96 0.88  Sodium 135 - 145 mmol/L 139 140 140  Potassium 3.5 - 5.1 mmol/L 4.1 5.2(H) 4.2  Chloride 98 - 111 mmol/L 108 106 106  CO2 22 - 32 mmol/L 22 26 26   Calcium 8.9 - 10.3 mg/dL 8.9 9.5 9.0  Total Protein 6.5 - 8.1 g/dL 6.9 7.2 6.8  Total Bilirubin 0.3 - 1.2 mg/dL 0.8 0.7 0.7  Alkaline Phos 38 - 126 U/L 100 118 99  AST 15 - 41 U/L 19 17 19   ALT 0 - 44 U/L 24 24 22    Iron/TIBC/Ferritin/ %Sat    Component Value Date/Time   IRON 224 (H) 01/18/2021 0817   TIBC 226 01/18/2021 0817   FERRITIN 44 11/28/2021 0809   IRONPCTSAT 99 (H) 01/18/2021 0817     RADIOGRAPHIC STUDIES: None to review.  ASSESSMENT & PLAN ANDYN SALES 66 y.o. male with medical history significant for hereditary hemachromatosis presents for a follow up visit.    Review of the labs and discussion with the patient his findings are most consistent with a hereditary hemochromatosis secondary to a homozygous C282Y mutation.  As such I do think it would be reasonable to continue moving forward with routine phlebotomies in order to decrease his ferritin level.  We did set an aggressive ferritin goal of ferritin less than 30 (after discussion we decided to take it from 50 down further to 30), but on 11/28/2021 the patient noted he would be  agreeable to the standard threshold of 50.  We will try to assure that we do not rapidly drop his hemoglobin to cause him to have any symptoms of fatigue, lightheadedness or dizziness.  On exam today Stuart Gaines is at his baseline level of health.  His ferritin was at goal at last check 3 months ago, rechecking levels today.  He established care with Dr. Mickeal Skinner who is helping to manage his headaches which is thought to be secondary to an excess CSF.  We'll plan to see Stuart Gaines back in 6 months time unless he requires further phlebotomy.   #Hereditary Hemachromatosis (Homozygous C282Y) --repeat CBC and ferritin levels today --plan to restart phlebotomy q 2 weeks if ferritin is found to be >50. Patient has completed 9 phlebotomies and has been on hold since reaching goal on 03/02/2021.  --ferritin today is at 44 with Hgb 15.6, last at 30 in Sept 2022, at target.  -- Echocardiogram on 12/15/2019 shows good baseline cardiac function -- RTC for lab visit in 3 months with clinic visit in 6 months  #Headaches - evaluated by neurology, continue care with Dr. Mickeal Skinner as needed  --headaches improved, occur infrequently  No orders of the defined types were placed in this encounter.   All questions were answered. The patient knows to call the clinic with any problems, questions or concerns.  A total of more than 30 minutes were spent on this encounter and over half of that time was spent on counseling and coordination of care as outlined above.   Ledell Peoples, MD Department of Hematology/Oncology [place] at [facility] Phone: [phone] Pager: [phone] Email: [email or web address]  11/28/2021 9:15 AM

## 2021-12-03 ENCOUNTER — Telehealth: Payer: Self-pay | Admitting: *Deleted

## 2021-12-03 NOTE — Telephone Encounter (Signed)
-----   Message from Orson Slick, MD sent at 12/03/2021  7:29 AM EST ----- Please let Mr. Radi know that his Ferritin is at 71, currently on target. No need for phlebotomy at this time. We will see him back in April for labs and July for a clinic visit.  ----- Message ----- From: Buel Ream, Lab In [place] Sent: 11/28/2021   8:23 AM EST To: Orson Slick, MD

## 2021-12-03 NOTE — Telephone Encounter (Signed)
TCT patient regarding recent lab results. Spoke with him and advised that his Ferritin is at target @ 44. Dr. Lorenso Courier wanted it <50.  Advised he does not need a phlebotomy.  He voiced understanding.  He is aware of future appts.

## 2022-02-19 DIAGNOSIS — M659 Synovitis and tenosynovitis, unspecified: Secondary | ICD-10-CM | POA: Diagnosis not present

## 2022-02-19 DIAGNOSIS — S61239A Puncture wound without foreign body of unspecified finger without damage to nail, initial encounter: Secondary | ICD-10-CM | POA: Diagnosis not present

## 2022-02-19 DIAGNOSIS — Z23 Encounter for immunization: Secondary | ICD-10-CM | POA: Diagnosis not present

## 2022-02-27 ENCOUNTER — Other Ambulatory Visit: Payer: Self-pay | Admitting: Hematology and Oncology

## 2022-02-27 ENCOUNTER — Inpatient Hospital Stay: Payer: Medicare Other | Attending: Hematology and Oncology

## 2022-02-27 ENCOUNTER — Other Ambulatory Visit: Payer: Self-pay

## 2022-02-27 DIAGNOSIS — M79642 Pain in left hand: Secondary | ICD-10-CM | POA: Diagnosis not present

## 2022-02-27 DIAGNOSIS — M13842 Other specified arthritis, left hand: Secondary | ICD-10-CM | POA: Diagnosis not present

## 2022-02-27 LAB — CMP (CANCER CENTER ONLY)
ALT: 34 U/L (ref 0–44)
AST: 23 U/L (ref 15–41)
Albumin: 4.4 g/dL (ref 3.5–5.0)
Alkaline Phosphatase: 106 U/L (ref 38–126)
Anion gap: 6 (ref 5–15)
BUN: 17 mg/dL (ref 8–23)
CO2: 28 mmol/L (ref 22–32)
Calcium: 9.5 mg/dL (ref 8.9–10.3)
Chloride: 104 mmol/L (ref 98–111)
Creatinine: 0.96 mg/dL (ref 0.61–1.24)
GFR, Estimated: >60 mL/min (ref >60)
Glucose, Bld: 124 mg/dL — ABNORMAL HIGH (ref 70–99)
Potassium: 4 mmol/L (ref 3.5–5.1)
Sodium: 138 mmol/L (ref 135–145)
Total Bilirubin: 1 mg/dL (ref 0.3–1.2)
Total Protein: 7.3 g/dL (ref 6.5–8.1)

## 2022-02-27 LAB — IRON AND IRON BINDING CAPACITY (CC-WL,HP ONLY)
Iron: 243 ug/dL — ABNORMAL HIGH (ref 45–182)
Saturation Ratios: 96 % — ABNORMAL HIGH (ref 17.9–39.5)
TIBC: 252 ug/dL (ref 250–450)
UIBC: 9 ug/dL — ABNORMAL LOW (ref 117–376)

## 2022-02-27 LAB — CBC WITH DIFFERENTIAL (CANCER CENTER ONLY)
Abs Immature Granulocytes: 0.01 10*3/uL (ref 0.00–0.07)
Basophils Absolute: 0.1 10*3/uL (ref 0.0–0.1)
Basophils Relative: 1 %
Eosinophils Absolute: 0.2 10*3/uL (ref 0.0–0.5)
Eosinophils Relative: 3 %
HCT: 43.9 % (ref 39.0–52.0)
Hemoglobin: 15.7 g/dL (ref 13.0–17.0)
Immature Granulocytes: 0 %
Lymphocytes Relative: 43 %
Lymphs Abs: 3 10*3/uL (ref 0.7–4.0)
MCH: 33 pg (ref 26.0–34.0)
MCHC: 35.8 g/dL (ref 30.0–36.0)
MCV: 92.2 fL (ref 80.0–100.0)
Monocytes Absolute: 0.4 10*3/uL (ref 0.1–1.0)
Monocytes Relative: 6 %
Neutro Abs: 3.3 10*3/uL (ref 1.7–7.7)
Neutrophils Relative %: 47 %
Platelet Count: 198 10*3/uL (ref 150–400)
RBC: 4.76 MIL/uL (ref 4.22–5.81)
RDW: 12.4 % (ref 11.5–15.5)
WBC Count: 7 10*3/uL (ref 4.0–10.5)
nRBC: 0 % (ref 0.0–0.2)

## 2022-02-27 LAB — FERRITIN: Ferritin: 62 ng/mL (ref 24–336)

## 2022-03-06 ENCOUNTER — Other Ambulatory Visit: Payer: Self-pay | Admitting: Family Medicine

## 2022-03-06 DIAGNOSIS — Z0000 Encounter for general adult medical examination without abnormal findings: Secondary | ICD-10-CM | POA: Diagnosis not present

## 2022-03-06 DIAGNOSIS — K219 Gastro-esophageal reflux disease without esophagitis: Secondary | ICD-10-CM | POA: Diagnosis not present

## 2022-03-06 DIAGNOSIS — E7800 Pure hypercholesterolemia, unspecified: Secondary | ICD-10-CM | POA: Diagnosis not present

## 2022-03-06 DIAGNOSIS — G894 Chronic pain syndrome: Secondary | ICD-10-CM | POA: Diagnosis not present

## 2022-03-06 DIAGNOSIS — Z77090 Contact with and (suspected) exposure to asbestos: Secondary | ICD-10-CM | POA: Diagnosis not present

## 2022-03-06 DIAGNOSIS — Z1389 Encounter for screening for other disorder: Secondary | ICD-10-CM | POA: Diagnosis not present

## 2022-03-06 DIAGNOSIS — J45909 Unspecified asthma, uncomplicated: Secondary | ICD-10-CM | POA: Diagnosis not present

## 2022-03-06 DIAGNOSIS — I10 Essential (primary) hypertension: Secondary | ICD-10-CM | POA: Diagnosis not present

## 2022-03-06 DIAGNOSIS — Z136 Encounter for screening for cardiovascular disorders: Secondary | ICD-10-CM

## 2022-03-06 DIAGNOSIS — M549 Dorsalgia, unspecified: Secondary | ICD-10-CM | POA: Diagnosis not present

## 2022-03-11 ENCOUNTER — Ambulatory Visit
Admission: RE | Admit: 2022-03-11 | Discharge: 2022-03-11 | Disposition: A | Discharge status: Home / Self Care | Payer: Medicare Other | Source: Ambulatory Visit | Attending: Family Medicine | Admitting: Family Medicine

## 2022-03-11 DIAGNOSIS — Z87891 Personal history of nicotine dependence: Secondary | ICD-10-CM | POA: Diagnosis not present

## 2022-03-11 DIAGNOSIS — Z136 Encounter for screening for cardiovascular disorders: Secondary | ICD-10-CM | POA: Diagnosis not present

## 2022-04-03 DIAGNOSIS — M13842 Other specified arthritis, left hand: Secondary | ICD-10-CM | POA: Diagnosis not present

## 2022-05-27 DIAGNOSIS — M25561 Pain in right knee: Secondary | ICD-10-CM | POA: Diagnosis not present

## 2022-05-28 ENCOUNTER — Other Ambulatory Visit: Payer: Self-pay | Admitting: Hematology and Oncology

## 2022-05-29 ENCOUNTER — Inpatient Hospital Stay: Payer: Medicare Other | Attending: Hematology and Oncology

## 2022-05-29 ENCOUNTER — Other Ambulatory Visit: Payer: Self-pay

## 2022-05-29 ENCOUNTER — Inpatient Hospital Stay (HOSPITAL_BASED_OUTPATIENT_CLINIC_OR_DEPARTMENT_OTHER): Payer: Medicare Other | Admitting: Hematology and Oncology

## 2022-05-29 DIAGNOSIS — G932 Benign intracranial hypertension: Secondary | ICD-10-CM | POA: Diagnosis not present

## 2022-05-29 DIAGNOSIS — Z87891 Personal history of nicotine dependence: Secondary | ICD-10-CM | POA: Insufficient documentation

## 2022-05-29 DIAGNOSIS — D72829 Elevated white blood cell count, unspecified: Secondary | ICD-10-CM | POA: Diagnosis not present

## 2022-05-29 LAB — FERRITIN: Ferritin: 102 ng/mL (ref 24–336)

## 2022-05-29 LAB — CBC WITH DIFFERENTIAL (CANCER CENTER ONLY)
Abs Immature Granulocytes: 0.11 10*3/uL — ABNORMAL HIGH (ref 0.00–0.07)
Basophils Absolute: 0 10*3/uL (ref 0.0–0.1)
Basophils Relative: 0 %
Eosinophils Absolute: 0 10*3/uL (ref 0.0–0.5)
Eosinophils Relative: 0 %
HCT: 42.1 % (ref 39.0–52.0)
Hemoglobin: 15.1 g/dL (ref 13.0–17.0)
Immature Granulocytes: 1 %
Lymphocytes Relative: 24 %
Lymphs Abs: 3.1 10*3/uL (ref 0.7–4.0)
MCH: 33.4 pg (ref 26.0–34.0)
MCHC: 35.9 g/dL (ref 30.0–36.0)
MCV: 93.1 fL (ref 80.0–100.0)
Monocytes Absolute: 0.6 10*3/uL (ref 0.1–1.0)
Monocytes Relative: 4 %
Neutro Abs: 9.5 10*3/uL — ABNORMAL HIGH (ref 1.7–7.7)
Neutrophils Relative %: 71 %
Platelet Count: 243 10*3/uL (ref 150–400)
RBC: 4.52 MIL/uL (ref 4.22–5.81)
RDW: 12.7 % (ref 11.5–15.5)
WBC Count: 13.3 10*3/uL — ABNORMAL HIGH (ref 4.0–10.5)
nRBC: 0 % (ref 0.0–0.2)

## 2022-05-29 LAB — CMP (CANCER CENTER ONLY)
ALT: 30 U/L (ref 0–44)
AST: 17 U/L (ref 15–41)
Albumin: 4.4 g/dL (ref 3.5–5.0)
Alkaline Phosphatase: 86 U/L (ref 38–126)
Anion gap: 7 (ref 5–15)
BUN: 28 mg/dL — ABNORMAL HIGH (ref 8–23)
CO2: 22 mmol/L (ref 22–32)
Calcium: 9.4 mg/dL (ref 8.9–10.3)
Chloride: 109 mmol/L (ref 98–111)
Creatinine: 0.96 mg/dL (ref 0.61–1.24)
GFR, Estimated: >60 mL/min (ref >60)
Glucose, Bld: 156 mg/dL — ABNORMAL HIGH (ref 70–99)
Potassium: 3.8 mmol/L (ref 3.5–5.1)
Sodium: 138 mmol/L (ref 135–145)
Total Bilirubin: 0.6 mg/dL (ref 0.3–1.2)
Total Protein: 7.2 g/dL (ref 6.5–8.1)

## 2022-05-29 LAB — IRON AND IRON BINDING CAPACITY (CC-WL,HP ONLY)
Iron: 257 ug/dL — ABNORMAL HIGH (ref 45–182)
Saturation Ratios: 100 % — ABNORMAL HIGH (ref 17.9–39.5)
TIBC: 256 ug/dL (ref 250–450)
UIBC: UNDETERMINED ug/dL (ref 117–376)

## 2022-05-29 NOTE — Progress Notes (Signed)
[place] Telephone:([phone]   [phone]  PROGRESS NOTE  Patient Care Team: Maury Dus, MD as PCP - General (Family Medicine)  Hematological/Oncological History # Hereditary Hemachromatosis (Homozygous C282Y) 1) 09/13/2019: Transferrin 189, Ferritin 117.4. PCP referred to hematology for further evaluation and management.  2) 10/05/2019: establish care with Dr. Lorenso Courier  3) 10/26/2019: Phlebotomy #1 4) 12/10/2019: Phlebotomy #2. Ferritin 249 (12/06/19)  5) 12/23/2019: Phlebotomy #3. Ferritin 145 6) 01/14/2020: Phlebotomy #4. Ferritin 117 7) 01/27/2020: Phlebotomy #5. Ferritin 88 8) 02/10/2020: Phlebotomy #6 and f/u visit. Ferritin 36.  9) 03/09/2020: Phlebotomy #7. Ferritin 22, held further phlebotomy.  10) 02/02/2021: Phlebotomy #8. Ferritin 56 11) 02/16/2021:Phlebotomy #9. Ferritin 52 12) 03/02/2021: Phlebotomy #10. Ferritin 25, held further phlebotomy.  Interval History:  Stuart Gaines 67 y.o. male with medical history significant for hereditary hemachromatosis presents for a follow up visit. The patient's last visit was on 11/28/2021. In the interim since the last visit he has had no major changes in his health.   On exam today today Stuart Gaines reports he had a knee injury over the weekend where he felt a pop in his right knee.  He went to his orthopedic office and that he had fluid on the knee and a tour meniscus.  They drained the fluid and give meloxicam pills as well as a steroid injection in the knee.  He reports that overall he feels considerably better.  He notes that he does occasionally hear his "blood pressure in his ears" but is not have any lightheadedness or dizziness.  He is currently working with his primary care doctor to readdress his blood pressure.  He notes his energy is good and his appetite is "too good".  He notes his weight is up approximate Levan pounds in the interim since her last visit.  He has not been having any issues with nausea,  vomiting, or diarrhea.  His appetite has been stable with no recent changes in weight. A full 10 point ROS is listed below.  Previously we discussed the utility of the phlebotomies and scheduling moving forward.  We continue to agree on every 2 week phlebotomies with frequent blood checks to assure we are not dropping his levels too low.  The patient was in agreement with this plan. Additionally he noted he was agreeable to a standard threshold of ferritin <50. Can restart phlebotomies if ferritin >150.   MEDICAL HISTORY:  Past Medical History:  Diagnosis Date   High cholesterol    Hypertension     SURGICAL HISTORY: Past Surgical History:  Procedure Laterality Date   BACK SURGERY     CHOLECYSTECTOMY      SOCIAL HISTORY: Social History   Socioeconomic History   Marital status: Legally Separated    Spouse name: Not on file   Number of children: 2   Years of education: Not on file   Highest education level: Not on file  Occupational History   Not on file  Tobacco Use   Smoking status: Former    Packs/day: 1.00    Years: 0.50    Total pack years: 0.50    Types: Cigarettes    Quit date: 74    Years since quitting: 48.5   Smokeless tobacco: Not on file  Substance and Sexual Activity   Alcohol use: No   Drug use: Not on file   Sexual activity: Not on file  Other Topics Concern   Not on file  Social History Narrative   Not  on file   Social Determinants of Health   Financial Resource Strain: Not on file  Food Insecurity: Not on file  Transportation Needs: Not on file  Physical Activity: Not on file  Stress: Not on file  Social Connections: Not on file  Intimate Partner Violence: Not on file    FAMILY HISTORY: Family History  Problem Relation Age of Onset   Anemia Mother    Stroke Father    Anemia Father    Diabetes Brother    Stroke Paternal Grandmother     ALLERGIES:  is allergic to duloxetine hcl, lisinopril, oxycodone, oxycodone hcl, pregabalin,  tramadol hcl, and ampicillin.  MEDICATIONS:  Current Outpatient Medications  Medication Sig Dispense Refill   amLODipine (NORVASC) 5 MG tablet Take 5 mg by mouth every morning.     atorvastatin (LIPITOR) 40 MG tablet Take 40 mg by mouth at bedtime.     diclofenac (VOLTAREN) 75 MG EC tablet Take 75 mg by mouth daily.     HYDROcodone-acetaminophen (NORCO) 7.5-325 MG per tablet Take 1 tablet by mouth 2 (two) times daily as needed (for pain).      lisinopril (ZESTRIL) 5 MG tablet Take 5 mg by mouth every morning.     Multiple Vitamins-Minerals (MULTIVITAMIN GUMMIES ADULT) CHEW See admin instructions.     omeprazole (PRILOSEC) 20 MG capsule Take 20 mg by mouth daily.     VENTOLIN HFA 108 (90 BASE) MCG/ACT inhaler Inhale 1-2 puffs into the lungs as needed.      No current facility-administered medications for this visit.    REVIEW OF SYSTEMS:   Constitutional: ( - ) fevers, ( - )  chills , ( - ) night sweats Eyes: ( - ) blurriness of vision, ( - ) double vision, ( - ) watery eyes Ears, nose, mouth, throat, and face: ( - ) mucositis, ( - ) sore throat Respiratory: ( - ) cough, ( - ) dyspnea, ( - ) wheezes Cardiovascular: ( - ) palpitation, ( - ) chest discomfort, ( - ) lower extremity swelling Gastrointestinal:  ( - ) nausea, ( - ) heartburn, ( - ) change in bowel habits Skin: ( - ) abnormal skin rashes Lymphatics: ( - ) new lymphadenopathy, ( - ) easy bruising Neurological: ( - ) numbness, ( - ) tingling, ( - ) new weaknesses Behavioral/Psych: ( - ) mood change, ( - ) new changes  All other systems were reviewed with the patient and are negative.  PHYSICAL EXAMINATION: ECOG PERFORMANCE STATUS: 1 - Symptomatic but completely ambulatory  Vitals:   05/29/22 1010  BP: (!) 144/78  Pulse: 83  Resp: 16  Temp: (!) 97.5 F (36.4 C)  SpO2: 99%   Filed Weights   05/29/22 1010  Weight: 236 lb 6.4 oz (107.2 kg)    GENERAL: well appearing elderly Caucasian male in no distress and  comfortable SKIN: skin color, texture, turgor are normal, no rashes or significant lesions EYES: conjunctiva are pink and non-injected, sclera clear LUNGS: clear to auscultation and percussion with normal breathing effort HEART: regular rate & rhythm and no murmurs and no lower extremity edema Musculoskeletal: no cyanosis of digits and no clubbing. Left foot no longer in large orthopedic boot.  PSYCH: alert & oriented x 3, fluent speech NEURO: no focal motor/sensory deficits  LABORATORY DATA:  I have reviewed the data as listed    Latest Ref Rng & Units 05/29/2022    9:40 AM 02/27/2022    9:36 AM 11/28/2021  8:10 AM  CBC  WBC 4.0 - 10.5 K/uL 13.3  7.0  7.3   Hemoglobin 13.0 - 17.0 g/dL 15.1  15.7  15.6   Hematocrit 39.0 - 52.0 % 42.1  43.9  43.9   Platelets 150 - 400 K/uL 243  198  218        Latest Ref Rng & Units 05/29/2022    9:40 AM 02/27/2022    9:36 AM 01/18/2021    8:17 AM  CMP  Glucose 70 - 99 mg/dL 156  124  111   BUN 8 - 23 mg/dL '28  17  15   '$ Creatinine 0.61 - 1.24 mg/dL 0.96  0.96  0.89   Sodium 135 - 145 mmol/L 138  138  139   Potassium 3.5 - 5.1 mmol/L 3.8  4.0  4.1   Chloride 98 - 111 mmol/L 109  104  108   CO2 22 - 32 mmol/L '22  28  22   '$ Calcium 8.9 - 10.3 mg/dL 9.4  9.5  8.9   Total Protein 6.5 - 8.1 g/dL 7.2  7.3  6.9   Total Bilirubin 0.3 - 1.2 mg/dL 0.6  1.0  0.8   Alkaline Phos 38 - 126 U/L 86  106  100   AST 15 - 41 U/L '17  23  19   '$ ALT 0 - 44 U/L 30  34  24    Iron/TIBC/Ferritin/ %Sat    Component Value Date/Time   IRON 257 (H) 05/29/2022 0940   TIBC 256 05/29/2022 0940   FERRITIN 62 02/27/2022 0936   IRONPCTSAT 100 (H) 05/29/2022 0940     RADIOGRAPHIC STUDIES: None to review.  ASSESSMENT & PLAN DEMARQUIS OSLEY 66 y.o. male with medical history significant for hereditary hemachromatosis presents for a follow up visit.    Review of the labs and discussion with the patient his findings are most consistent with a hereditary hemochromatosis  secondary to a homozygous C282Y mutation.  As such I do think it would be reasonable to continue moving forward with routine phlebotomies in order to decrease his ferritin level.  We did set an aggressive ferritin goal of ferritin less than 30 (after discussion we decided to take it from 50 down further to 30), but on 11/28/2021 the patient noted he would be agreeable to the standard threshold of 50.  We will try to assure that we do not rapidly drop his hemoglobin to cause him to have any symptoms of fatigue, lightheadedness or dizziness.  On exam today Stuart Gaines is at his baseline level of health.  His ferritin was at goal at last check 3 months ago, rechecking levels today.  He established care with Dr. Mickeal Skinner who is helping to manage his headaches which is thought to be secondary to an excess CSF.  We'll plan to see Stuart Gaines back in 6 months time unless he requires further phlebotomy.   #Hereditary Hemachromatosis (Homozygous C282Y) --repeat CBC and ferritin levels today --plan to restart phlebotomy q 2 weeks if ferritin is found to be >150. Patient has completed 9 phlebotomies and has been on hold since reaching goal on 03/02/2021.  --ferritin and iron panel pending from today.  Labs show hemoglobin 15.1, MCV 93.1, and platelets of 243. -- Echocardiogram on 12/15/2019 shows good baseline cardiac function -- RTC for lab visit in 3 months with clinic visit in 6 months  # Leukocytosis, neutrophilic predominance -- Patient underwent a steroid knee injection on Monday, 05/27/2022 --This explains a  neutrophilic predominant leukocytosis seen on labs today. --Continue to monitor.  Would expect resolution shortly.  # Headaches - evaluated by neurology, continue care with Dr. Mickeal Skinner as needed  --headaches improved, occur infrequently  No orders of the defined types were placed in this encounter.   All questions were answered. The patient knows to call the clinic with any problems, questions or  concerns.  A total of more than 30 minutes were spent on this encounter and over half of that time was spent on counseling and coordination of care as outlined above.   Ledell Peoples, MD Department of Hematology/Oncology [place] at [facility] Phone: [phone] Pager: [phone] Email: [email or web address]  05/29/2022 10:45 AM

## 2022-05-31 ENCOUNTER — Telehealth: Payer: Self-pay | Admitting: *Deleted

## 2022-05-31 NOTE — Telephone Encounter (Signed)
TCT patient regarding recent lab results. No answer. Was able to leave vm message for pt to return call to review recent lab  results. Advised to call [phone]

## 2022-05-31 NOTE — Telephone Encounter (Signed)
-----   Message from Orson Slick, MD sent at 05/30/2022  9:09 AM EDT ----- Please let Mr. Lafoe know that his ferritin is at 101. We will start phlebotomies back at ferritin >150. He has labs in Oct and a repeat visit with [place] in Jan 2023.   ----- Message ----- From: Buel Ream, Lab In [place] Sent: 05/29/2022   9:46 AM EDT To: Orson Slick, MD

## 2022-06-17 DIAGNOSIS — M25561 Pain in right knee: Secondary | ICD-10-CM | POA: Diagnosis not present

## 2022-07-13 IMAGING — US US ABDOMINAL AORTA SCREENING AAA
1 series · 14 of 25 positions shown · non-contrast
Comparison: None.

CLINICAL DATA: Male between 65-75 years of age with a smoking
history.

EXAM:
US ABDOMINAL AORTA MEDICARE SCREENING
TECHNIQUE: Ultrasound examination of the abdominal aorta was performed as a
screening evaluation for abdominal aortic aneurysm.

[Series 1: us abdominal aorta screening aaa · 0.33mm/px · 14 of 25 slices shown]
[im 1/25]
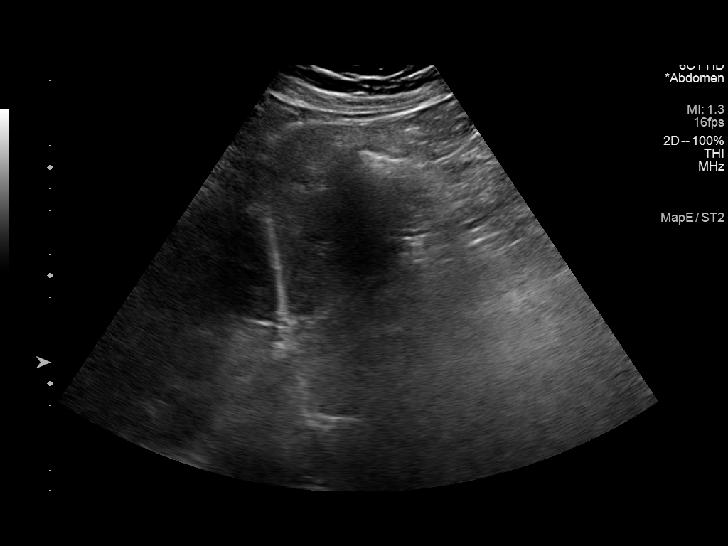
[im 3/25]
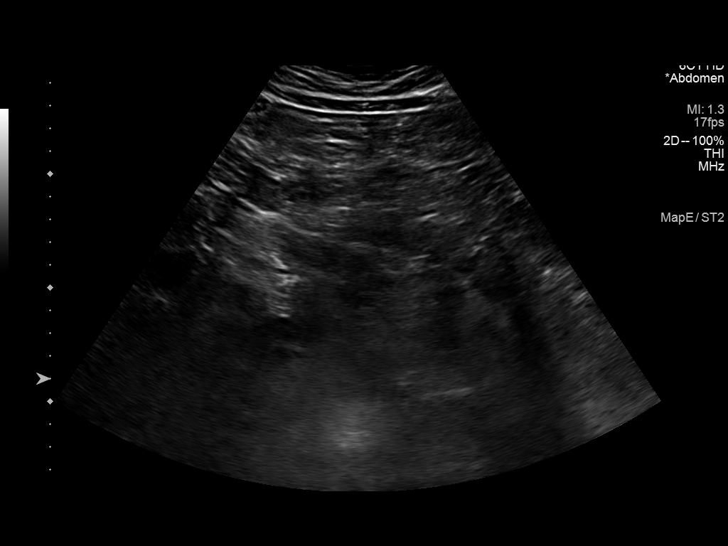
[im 5/25]
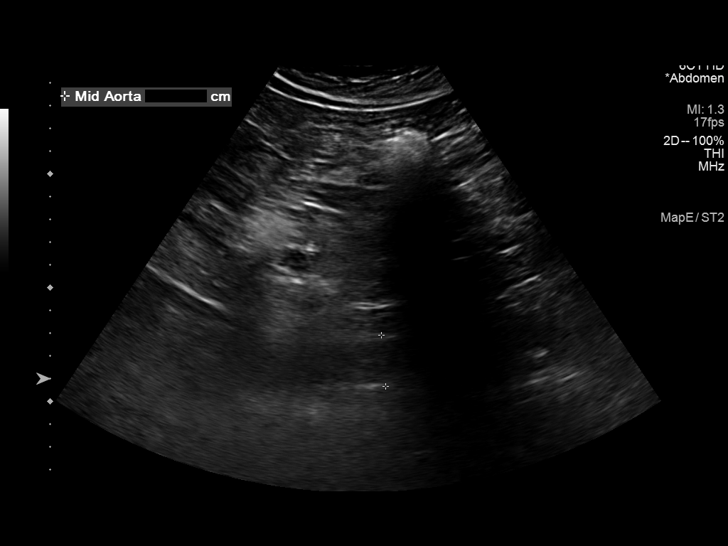
[im 7/25]
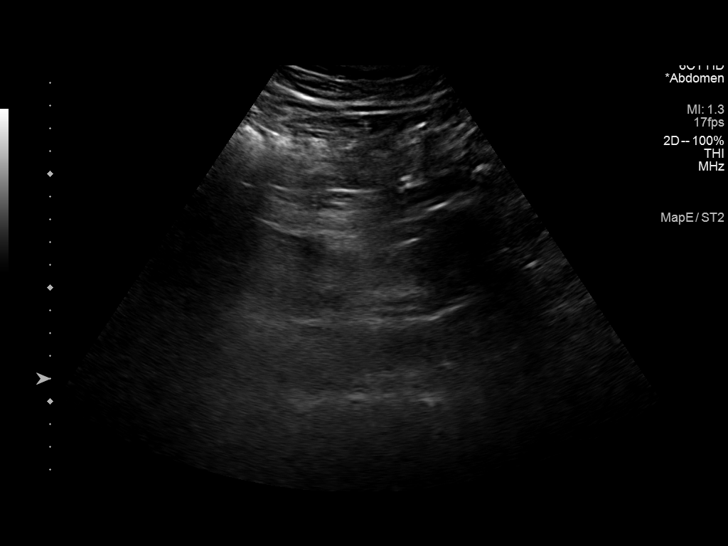
[im 9/25]
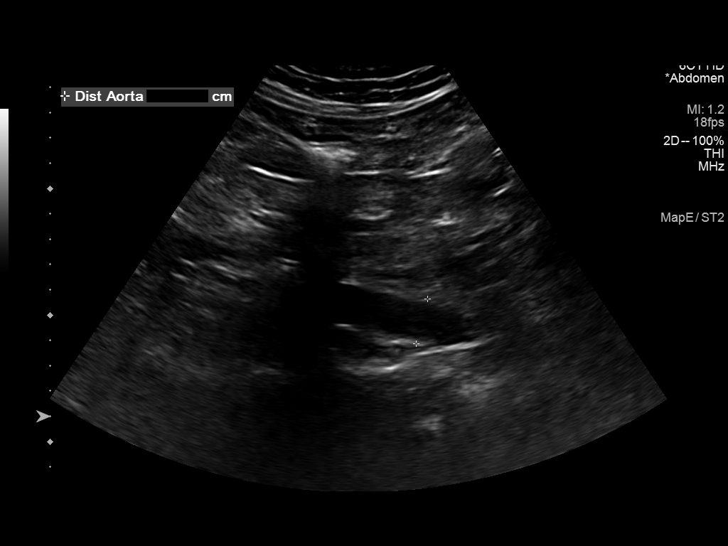
[im 10/25]
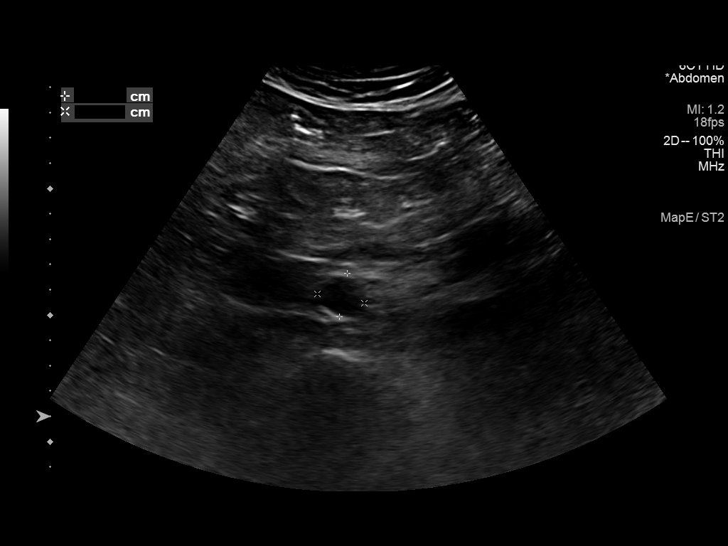
[im 12/25]
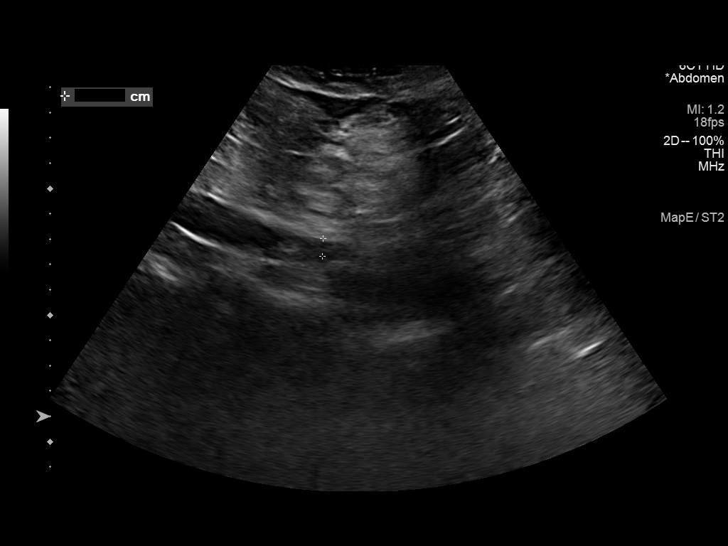
[im 14/25]
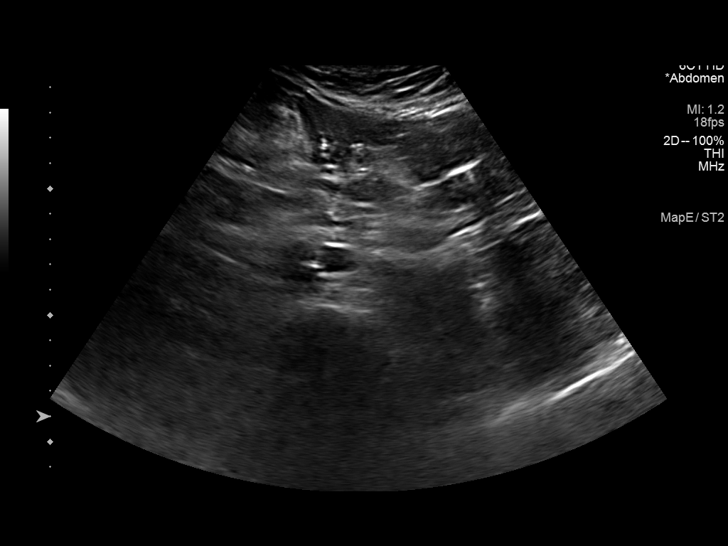
[im 16/25]
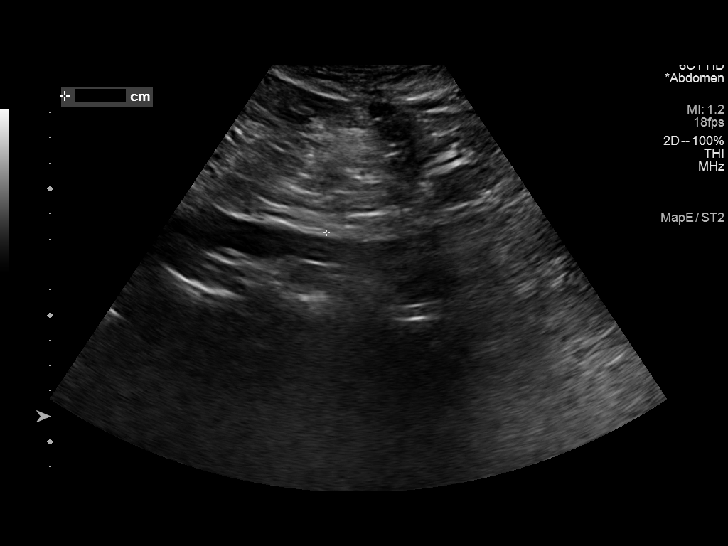
[im 17/25]
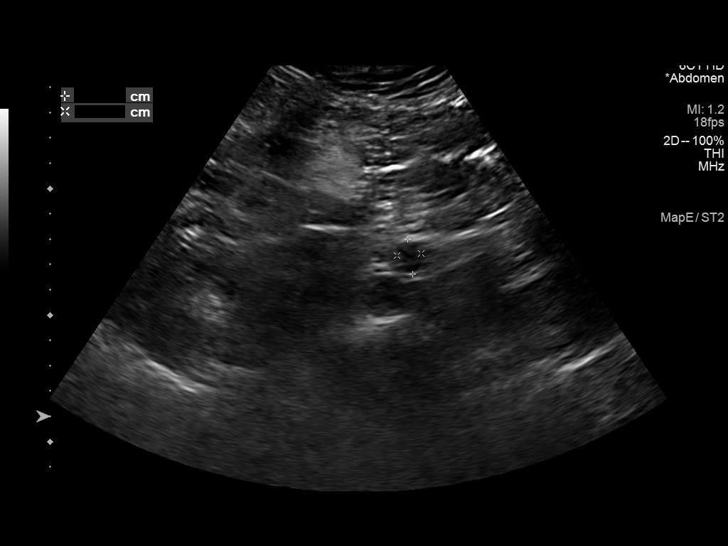
[im 19/25]
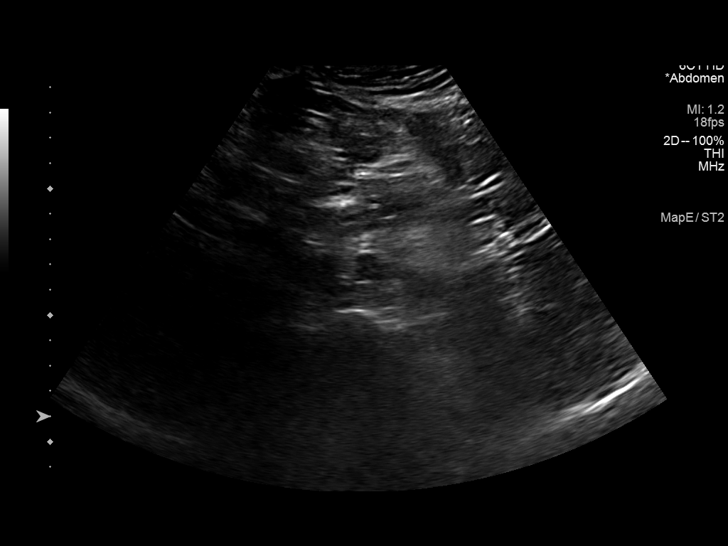
[im 21/25]
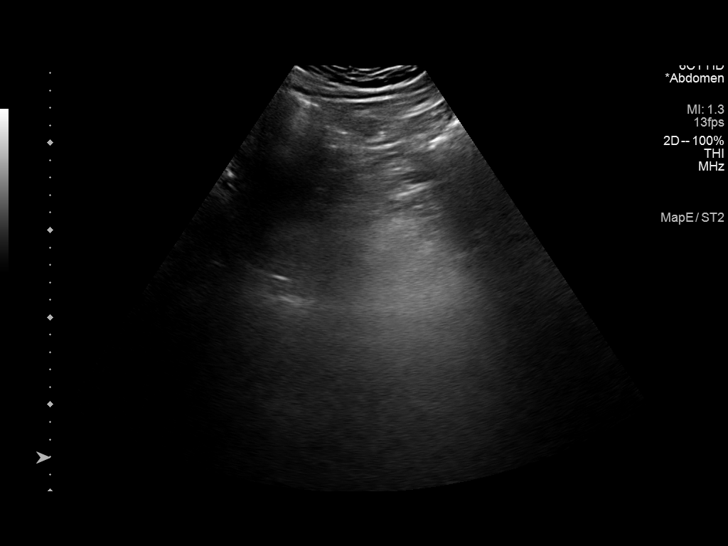
[im 23/25]
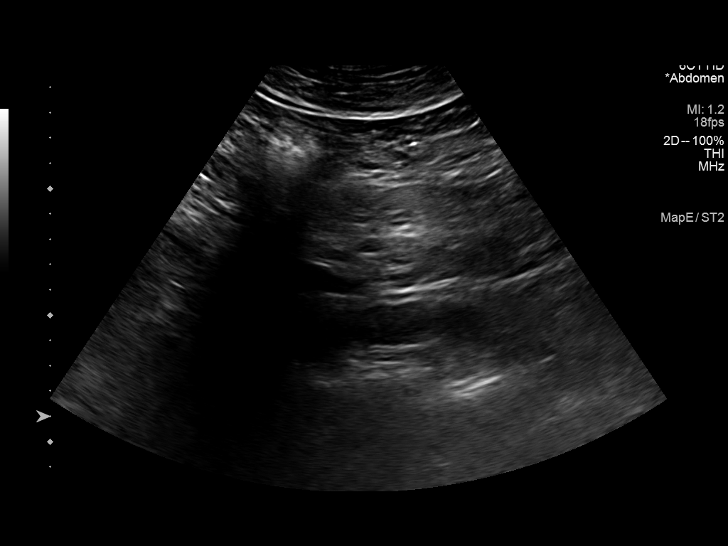
[im 25/25]
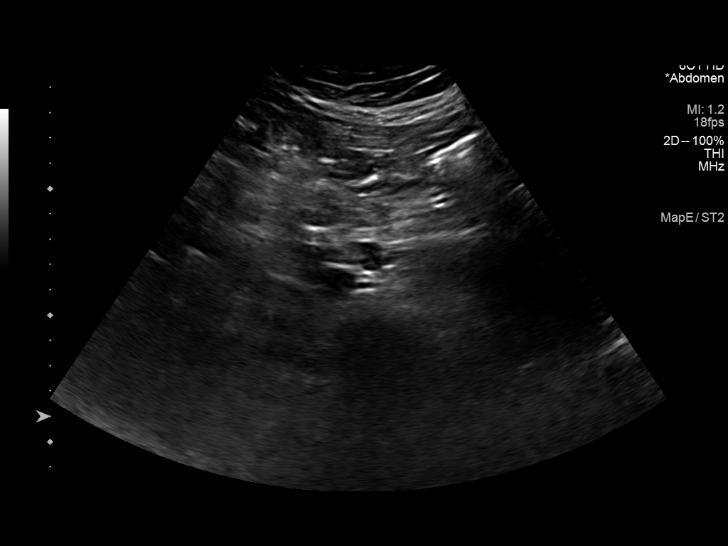

[14 of 25 positions shown; findings below may reference images not displayed]

FINDINGS: Abdominal aortic measurements as follows:

Proximal: Obscured by overlying bowel gas

Mid:  2.3 cm

Distal:  1.8 cm
IMPRESSION: No evidence of abdominal aortic aneurysm.

## 2022-08-22 DIAGNOSIS — M25561 Pain in right knee: Secondary | ICD-10-CM | POA: Diagnosis not present

## 2022-08-29 ENCOUNTER — Other Ambulatory Visit: Payer: Self-pay | Admitting: *Deleted

## 2022-08-29 ENCOUNTER — Inpatient Hospital Stay: Payer: Medicare Other | Attending: Hematology and Oncology

## 2022-08-29 ENCOUNTER — Telehealth: Payer: Self-pay | Admitting: *Deleted

## 2022-08-29 LAB — CMP (CANCER CENTER ONLY)
ALT: 29 U/L (ref 0–44)
AST: 18 U/L (ref 15–41)
Albumin: 4.2 g/dL (ref 3.5–5.0)
Alkaline Phosphatase: 113 U/L (ref 38–126)
Anion gap: 6 (ref 5–15)
BUN: 21 mg/dL (ref 8–23)
CO2: 26 mmol/L (ref 22–32)
Calcium: 9.1 mg/dL (ref 8.9–10.3)
Chloride: 106 mmol/L (ref 98–111)
Creatinine: 0.9 mg/dL (ref 0.61–1.24)
GFR, Estimated: >60 mL/min (ref >60)
Glucose, Bld: 146 mg/dL — ABNORMAL HIGH (ref 70–99)
Potassium: 4 mmol/L (ref 3.5–5.1)
Sodium: 138 mmol/L (ref 135–145)
Total Bilirubin: 0.8 mg/dL (ref 0.3–1.2)
Total Protein: 6.9 g/dL (ref 6.5–8.1)

## 2022-08-29 LAB — CBC WITH DIFFERENTIAL (CANCER CENTER ONLY)
Abs Immature Granulocytes: 0.03 10*3/uL (ref 0.00–0.07)
Basophils Absolute: 0 10*3/uL (ref 0.0–0.1)
Basophils Relative: 0 %
Eosinophils Absolute: 0.1 10*3/uL (ref 0.0–0.5)
Eosinophils Relative: 1 %
HCT: 42.2 % (ref 39.0–52.0)
Hemoglobin: 15.5 g/dL (ref 13.0–17.0)
Immature Granulocytes: 0 %
Lymphocytes Relative: 40 %
Lymphs Abs: 3.8 10*3/uL (ref 0.7–4.0)
MCH: 34 pg (ref 26.0–34.0)
MCHC: 36.7 g/dL — ABNORMAL HIGH (ref 30.0–36.0)
MCV: 92.5 fL (ref 80.0–100.0)
Monocytes Absolute: 0.5 10*3/uL (ref 0.1–1.0)
Monocytes Relative: 6 %
Neutro Abs: 5 10*3/uL (ref 1.7–7.7)
Neutrophils Relative %: 53 %
Platelet Count: 209 10*3/uL (ref 150–400)
RBC: 4.56 MIL/uL (ref 4.22–5.81)
RDW: 12.4 % (ref 11.5–15.5)
WBC Count: 9.5 10*3/uL (ref 4.0–10.5)
nRBC: 0 % (ref 0.0–0.2)

## 2022-08-29 LAB — IRON AND IRON BINDING CAPACITY (CC-WL,HP ONLY)
Iron: 162 ug/dL (ref 45–182)
Saturation Ratios: 67 % — ABNORMAL HIGH (ref 17.9–39.5)
TIBC: 242 ug/dL — ABNORMAL LOW (ref 250–450)
UIBC: 80 ug/dL — ABNORMAL LOW (ref 117–376)

## 2022-08-29 LAB — FERRITIN: Ferritin: 84 ng/mL (ref 24–336)

## 2022-08-29 NOTE — Telephone Encounter (Signed)
TCT patient regarding recent lab results.  Spoke with him and advised that his Ferritin is on target of less than 150, at 84. Advised that there is no need for phlebotomy. He is aware of his f/u appt in January 2024

## 2022-08-29 NOTE — Telephone Encounter (Signed)
-----   Message from Orson Slick, MD sent at 08/29/2022  2:55 PM EDT ----- Please let Mr. Stuart Gaines know that his ferritin is currently at 33.  This is on target at our goal of less than 150.  No need for phlebotomy at this time.  We will plan to see him back in January as scheduled. ----- Message ----- From: Buel Ream, Lab In [place] Sent: 08/29/2022   9:43 AM EDT To: Orson Slick, MD

## 2022-09-05 DIAGNOSIS — M25561 Pain in right knee: Secondary | ICD-10-CM | POA: Diagnosis not present

## 2022-09-09 DIAGNOSIS — G894 Chronic pain syndrome: Secondary | ICD-10-CM | POA: Diagnosis not present

## 2022-09-09 DIAGNOSIS — E7800 Pure hypercholesterolemia, unspecified: Secondary | ICD-10-CM | POA: Diagnosis not present

## 2022-09-09 DIAGNOSIS — J45909 Unspecified asthma, uncomplicated: Secondary | ICD-10-CM | POA: Diagnosis not present

## 2022-09-09 DIAGNOSIS — M25561 Pain in right knee: Secondary | ICD-10-CM | POA: Diagnosis not present

## 2022-09-09 DIAGNOSIS — Z77090 Contact with and (suspected) exposure to asbestos: Secondary | ICD-10-CM | POA: Diagnosis not present

## 2022-09-09 DIAGNOSIS — Z23 Encounter for immunization: Secondary | ICD-10-CM | POA: Diagnosis not present

## 2022-09-09 DIAGNOSIS — I10 Essential (primary) hypertension: Secondary | ICD-10-CM | POA: Diagnosis not present

## 2022-09-09 DIAGNOSIS — K219 Gastro-esophageal reflux disease without esophagitis: Secondary | ICD-10-CM | POA: Diagnosis not present

## 2022-09-09 DIAGNOSIS — M545 Low back pain, unspecified: Secondary | ICD-10-CM | POA: Diagnosis not present

## 2022-09-11 DIAGNOSIS — S83241A Other tear of medial meniscus, current injury, right knee, initial encounter: Secondary | ICD-10-CM | POA: Diagnosis not present

## 2022-09-19 ENCOUNTER — Other Ambulatory Visit: Payer: Self-pay | Admitting: Urology

## 2022-09-19 DIAGNOSIS — R972 Elevated prostate specific antigen [PSA]: Secondary | ICD-10-CM

## 2022-10-13 ENCOUNTER — Ambulatory Visit
Admission: RE | Admit: 2022-10-13 | Discharge: 2022-10-13 | Disposition: A | Discharge status: Home / Self Care | Payer: Medicare Other | Source: Ambulatory Visit | Attending: Urology | Admitting: Urology

## 2022-10-13 DIAGNOSIS — R972 Elevated prostate specific antigen [PSA]: Secondary | ICD-10-CM

## 2022-10-13 MED ORDER — GADOPICLENOL 0.5 MMOL/ML IV SOLN
10.0000 mL | Freq: Once | INTRAVENOUS | Status: AC | PRN
  Administered 2022-10-13: 10 mL via INTRAVENOUS

## 2022-10-21 DIAGNOSIS — M2341 Loose body in knee, right knee: Secondary | ICD-10-CM | POA: Diagnosis not present

## 2022-10-21 DIAGNOSIS — G8918 Other acute postprocedural pain: Secondary | ICD-10-CM | POA: Diagnosis not present

## 2022-10-21 DIAGNOSIS — M659 Synovitis and tenosynovitis, unspecified: Secondary | ICD-10-CM | POA: Diagnosis not present

## 2022-10-21 DIAGNOSIS — M65861 Other synovitis and tenosynovitis, right lower leg: Secondary | ICD-10-CM | POA: Diagnosis not present

## 2022-10-21 DIAGNOSIS — S83271A Complex tear of lateral meniscus, current injury, right knee, initial encounter: Secondary | ICD-10-CM | POA: Diagnosis not present

## 2022-10-21 DIAGNOSIS — S83231A Complex tear of medial meniscus, current injury, right knee, initial encounter: Secondary | ICD-10-CM | POA: Diagnosis not present

## 2022-10-21 DIAGNOSIS — M94261 Chondromalacia, right knee: Secondary | ICD-10-CM | POA: Diagnosis not present

## 2022-11-05 ENCOUNTER — Other Ambulatory Visit: Payer: Self-pay | Admitting: Orthopedic Surgery

## 2022-11-05 DIAGNOSIS — M79671 Pain in right foot: Secondary | ICD-10-CM | POA: Diagnosis not present

## 2022-11-05 DIAGNOSIS — Z4789 Encounter for other orthopedic aftercare: Secondary | ICD-10-CM | POA: Diagnosis not present

## 2022-11-15 DIAGNOSIS — S92314A Nondisplaced fracture of first metatarsal bone, right foot, initial encounter for closed fracture: Secondary | ICD-10-CM | POA: Diagnosis not present

## 2022-11-15 DIAGNOSIS — M19071 Primary osteoarthritis, right ankle and foot: Secondary | ICD-10-CM | POA: Diagnosis not present

## 2022-11-28 ENCOUNTER — Telehealth: Payer: Self-pay

## 2022-11-28 ENCOUNTER — Inpatient Hospital Stay: Payer: Medicare Other | Attending: Hematology and Oncology

## 2022-11-28 ENCOUNTER — Inpatient Hospital Stay (HOSPITAL_BASED_OUTPATIENT_CLINIC_OR_DEPARTMENT_OTHER): Payer: Medicare Other | Admitting: Hematology and Oncology

## 2022-11-28 ENCOUNTER — Other Ambulatory Visit: Payer: Self-pay | Admitting: Hematology and Oncology

## 2022-11-28 DIAGNOSIS — Z87891 Personal history of nicotine dependence: Secondary | ICD-10-CM | POA: Insufficient documentation

## 2022-11-28 DIAGNOSIS — I10 Essential (primary) hypertension: Secondary | ICD-10-CM | POA: Diagnosis not present

## 2022-11-28 DIAGNOSIS — R972 Elevated prostate specific antigen [PSA]: Secondary | ICD-10-CM | POA: Diagnosis not present

## 2022-11-28 LAB — CMP (CANCER CENTER ONLY)
ALT: 35 U/L (ref 0–44)
AST: 24 U/L (ref 15–41)
Albumin: 4.4 g/dL (ref 3.5–5.0)
Alkaline Phosphatase: 127 U/L — ABNORMAL HIGH (ref 38–126)
Anion gap: 10 (ref 5–15)
BUN: 14 mg/dL (ref 8–23)
CO2: 25 mmol/L (ref 22–32)
Calcium: 9.6 mg/dL (ref 8.9–10.3)
Chloride: 104 mmol/L (ref 98–111)
Creatinine: 0.97 mg/dL (ref 0.61–1.24)
GFR, Estimated: >60 mL/min (ref >60)
Glucose, Bld: 151 mg/dL — ABNORMAL HIGH (ref 70–99)
Potassium: 3.8 mmol/L (ref 3.5–5.1)
Sodium: 139 mmol/L (ref 135–145)
Total Bilirubin: 0.9 mg/dL (ref 0.3–1.2)
Total Protein: 7.3 g/dL (ref 6.5–8.1)

## 2022-11-28 LAB — CBC WITH DIFFERENTIAL (CANCER CENTER ONLY)
Abs Immature Granulocytes: 0.02 10*3/uL (ref 0.00–0.07)
Basophils Absolute: 0.1 10*3/uL (ref 0.0–0.1)
Basophils Relative: 1 %
Eosinophils Absolute: 0.1 10*3/uL (ref 0.0–0.5)
Eosinophils Relative: 1 %
HCT: 45.1 % (ref 39.0–52.0)
Hemoglobin: 16.3 g/dL (ref 13.0–17.0)
Immature Granulocytes: 0 %
Lymphocytes Relative: 47 %
Lymphs Abs: 3.9 10*3/uL (ref 0.7–4.0)
MCH: 32.9 pg (ref 26.0–34.0)
MCHC: 36.1 g/dL — ABNORMAL HIGH (ref 30.0–36.0)
MCV: 90.9 fL (ref 80.0–100.0)
Monocytes Absolute: 0.4 10*3/uL (ref 0.1–1.0)
Monocytes Relative: 5 %
Neutro Abs: 3.9 10*3/uL (ref 1.7–7.7)
Neutrophils Relative %: 46 %
Platelet Count: 267 10*3/uL (ref 150–400)
RBC: 4.96 MIL/uL (ref 4.22–5.81)
RDW: 12.1 % (ref 11.5–15.5)
WBC Count: 8.4 10*3/uL (ref 4.0–10.5)
nRBC: 0 % (ref 0.0–0.2)

## 2022-11-28 LAB — IRON AND IRON BINDING CAPACITY (CC-WL,HP ONLY)
Iron: 251 ug/dL — ABNORMAL HIGH (ref 45–182)
Saturation Ratios: 97 % — ABNORMAL HIGH (ref 17.9–39.5)
TIBC: 259 ug/dL (ref 250–450)
UIBC: 8 ug/dL — ABNORMAL LOW (ref 117–376)

## 2022-11-28 LAB — FERRITIN: Ferritin: 103 ng/mL (ref 24–336)

## 2022-11-28 NOTE — Telephone Encounter (Signed)
Erroneous entry

## 2022-11-28 NOTE — Progress Notes (Signed)
[name] [phone]   [phone]  PROGRESS NOTE  Patient Care Team: Maury Dus, MD as PCP - General (Family Medicine)  Hematological/Oncological History # Hereditary Hemachromatosis (Homozygous C282Y) 1) 09/13/2019: Transferrin 189, Ferritin 117.4. PCP referred to hematology for further evaluation and management.  2) 10/05/2019: establish care with Dr. Lorenso Courier  3) 10/26/2019: Phlebotomy #1 4) 12/10/2019: Phlebotomy #2. Ferritin 249 (12/06/19)  5) 12/23/2019: Phlebotomy #3. Ferritin 145 6) 01/14/2020: Phlebotomy #4. Ferritin 117 7) 01/27/2020: Phlebotomy #5. Ferritin 88 8) 02/10/2020: Phlebotomy #6 and f/u visit. Ferritin 36.  9) 03/09/2020: Phlebotomy #7. Ferritin 22, held further phlebotomy.  10) 02/02/2021: Phlebotomy #8. Ferritin 56 11) 02/16/2021:Phlebotomy #9. Ferritin 52 12) 03/02/2021: Phlebotomy #10. Ferritin 25, held further phlebotomy.  Interval History:  Stuart Gaines 67 y.o. male with medical history significant for hereditary hemachromatosis presents for a follow up visit. The patient's last visit was on 05/29/2022. In the interim since the last visit he has had 2 episodes of torn meniscus and recent prostate biopsy due to elevated PSA levels.  On exam today today Stuart Gaines reports he has been having continued difficulties with his right ankle and knee.  He reports that he is torn his medial meniscus 2 times.  He is also concerned because his PSA has doubled and he recently underwent prostate biopsies with Dr. Jeffie Pollock.  He reports that they were "3 areas of concern".  He will getting the final biopsy results next week.  He reports that he is also having MRIs performed of his lower extremity later this month.  Otherwise he is not had any new medications or other major changes in his health.  He has not been having any issues with nausea, vomiting, or diarrhea.  His appetite has been stable with no recent changes in weight. A full 10 point ROS is  listed below.  Previously we discussed the utility of the phlebotomies and scheduling moving forward.  We continue to agree on every 2 week phlebotomies with frequent blood checks to assure we are not dropping his levels too low.  The patient was in agreement with this plan. Additionally he noted he was agreeable to a standard threshold of ferritin <50. Can restart phlebotomies if ferritin >150.   MEDICAL HISTORY:  Past Medical History:  Diagnosis Date   High cholesterol    Hypertension     SURGICAL HISTORY: Past Surgical History:  Procedure Laterality Date   BACK SURGERY     CHOLECYSTECTOMY      SOCIAL HISTORY: Social History   Socioeconomic History   Marital status: Divorced    Spouse name: Not on file   Number of children: 2   Years of education: Not on file   Highest education level: Not on file  Occupational History   Not on file  Tobacco Use   Smoking status: Former    Packs/day: 1.00    Years: 0.50    Total pack years: 0.50    Types: Cigarettes    Quit date: 21    Years since quitting: 49.0   Smokeless tobacco: Not on file  Substance and Sexual Activity   Alcohol use: No   Drug use: Not on file   Sexual activity: Not on file  Other Topics Concern   Not on file  Social History Narrative   Not on file   Social Determinants of Health   Financial Resource Strain: Not on file  Food Insecurity: Not on file  Transportation Needs: Not on file  Physical Activity: Not on file  Stress: Not on file  Social Connections: Not on file  Intimate Partner Violence: Not on file    FAMILY HISTORY: Family History  Problem Relation Age of Onset   Anemia Mother    Stroke Father    Anemia Father    Diabetes Brother    Stroke Paternal Grandmother     ALLERGIES:  is allergic to duloxetine hcl, lisinopril, oxycodone, oxycodone hcl, pregabalin, tramadol hcl, and ampicillin.  MEDICATIONS:  Current Outpatient Medications  Medication Sig Dispense Refill   amLODipine  (NORVASC) 5 MG tablet Take 5 mg by mouth every morning.     atorvastatin (LIPITOR) 40 MG tablet Take 40 mg by mouth at bedtime.     HYDROcodone-acetaminophen (NORCO) 7.5-325 MG per tablet Take 1 tablet by mouth 2 (two) times daily as needed (for pain).      lisinopril (ZESTRIL) 5 MG tablet Take 5 mg by mouth every morning.     Multiple Vitamins-Minerals (MULTIVITAMIN GUMMIES ADULT) CHEW See admin instructions.     omeprazole (PRILOSEC) 20 MG capsule Take 20 mg by mouth daily.     VENTOLIN HFA 108 (90 BASE) MCG/ACT inhaler Inhale 1-2 puffs into the lungs as needed.      No current facility-administered medications for this visit.    REVIEW OF SYSTEMS:   Constitutional: ( - ) fevers, ( - )  chills , ( - ) night sweats Eyes: ( - ) blurriness of vision, ( - ) double vision, ( - ) watery eyes Ears, nose, mouth, throat, and face: ( - ) mucositis, ( - ) sore throat Respiratory: ( - ) cough, ( - ) dyspnea, ( - ) wheezes Cardiovascular: ( - ) palpitation, ( - ) chest discomfort, ( - ) lower extremity swelling Gastrointestinal:  ( - ) nausea, ( - ) heartburn, ( - ) change in bowel habits Skin: ( - ) abnormal skin rashes Lymphatics: ( - ) new lymphadenopathy, ( - ) easy bruising Neurological: ( - ) numbness, ( - ) tingling, ( - ) new weaknesses Behavioral/Psych: ( - ) mood change, ( - ) new changes  All other systems were reviewed with the patient and are negative.  PHYSICAL EXAMINATION: ECOG PERFORMANCE STATUS: 1 - Symptomatic but completely ambulatory  Vitals:   11/28/22 0954  BP: (!) 143/80  Pulse: (!) 104  Resp: 20  Temp: 98.8 F (37.1 C)  SpO2: 97%   Filed Weights   11/28/22 0954  Weight: 236 lb 1.6 oz (107.1 kg)    GENERAL: well appearing elderly Caucasian male in no distress and comfortable SKIN: skin color, texture, turgor are normal, no rashes or significant lesions EYES: conjunctiva are pink and non-injected, sclera clear LUNGS: clear to auscultation and percussion with  normal breathing effort HEART: regular rate & rhythm and no murmurs and no lower extremity edema Musculoskeletal: no cyanosis of digits and no clubbing. Left foot no longer in large orthopedic boot.  PSYCH: alert & oriented x 3, fluent speech NEURO: no focal motor/sensory deficits  LABORATORY DATA:  I have reviewed the data as listed    Latest Ref Rng & Units 11/28/2022    9:35 AM 08/29/2022    9:32 AM 05/29/2022    9:40 AM  CBC  WBC 4.0 - 10.5 K/uL 8.4  9.5  13.3   Hemoglobin 13.0 - 17.0 g/dL 16.3  15.5  15.1   Hematocrit 39.0 - 52.0 % 45.1  42.2  42.1   Platelets 150 -  400 K/uL 267  209  243        Latest Ref Rng & Units 11/28/2022    9:35 AM 08/29/2022    9:32 AM 05/29/2022    9:40 AM  CMP  Glucose 70 - 99 mg/dL 151  146  156   BUN 8 - 23 mg/dL '14  21  28   '$ Creatinine 0.61 - 1.24 mg/dL 0.97  0.90  0.96   Sodium 135 - 145 mmol/L 139  138  138   Potassium 3.5 - 5.1 mmol/L 3.8  4.0  3.8   Chloride 98 - 111 mmol/L 104  106  109   CO2 22 - 32 mmol/L '25  26  22   '$ Calcium 8.9 - 10.3 mg/dL 9.6  9.1  9.4   Total Protein 6.5 - 8.1 g/dL 7.3  6.9  7.2   Total Bilirubin 0.3 - 1.2 mg/dL 0.9  0.8  0.6   Alkaline Phos 38 - 126 U/L 127  113  86   AST 15 - 41 U/L '24  18  17   '$ ALT 0 - 44 U/L 35  29  30    Iron/TIBC/Ferritin/ %Sat    Component Value Date/Time   IRON 251 (H) 11/28/2022 0935   TIBC 259 11/28/2022 0935   FERRITIN 84 08/29/2022 0932   IRONPCTSAT 97 (H) 11/28/2022 0935     RADIOGRAPHIC STUDIES: None to review.  ASSESSMENT & PLAN Stuart Gaines 67 y.o. male with medical history significant for hereditary hemachromatosis presents for a follow up visit.    Review of the labs and discussion with the patient his findings are most consistent with a hereditary hemochromatosis secondary to a homozygous C282Y mutation.  As such I do think it would be reasonable to continue moving forward with routine phlebotomies in order to decrease his ferritin level.  We did set an  aggressive ferritin goal of ferritin less than 30 (after discussion we decided to take it from 50 down further to 30), but on 11/28/2021 the patient noted he would be agreeable to the standard threshold of 50.  We will try to assure that we do not rapidly drop his hemoglobin to cause him to have any symptoms of fatigue, lightheadedness or dizziness.  On exam today Stuart Gaines is at his baseline level of health.  His ferritin was at goal at last check 3 months ago, rechecking levels today.  He established care with Dr. Mickeal Skinner who is helping to manage his headaches which is thought to be secondary to an excess CSF.  We'll plan to see Stuart Gaines back in 6 months time unless he requires further phlebotomy.   #Hereditary Hemachromatosis (Homozygous C282Y) --repeat CBC and ferritin levels today --plan to restart phlebotomy q 2 weeks if ferritin is found to be >150. Patient has completed 9 phlebotomies and has been on hold since reaching goal on 03/02/2021.  --ferritin and iron panel pending from today.  Labs show hemoglobin 16.3, MCV 90.9, platelets 267, and white blood cell count 8.4. -- Echocardiogram on 12/15/2019 shows good baseline cardiac function -- RTC for lab visit in 3 months with clinic visit in 6 months  # Elevated PSA, recent prostate biopsy -- We will be available if there is anything we do to help manage the results of his prostate biopsy.  # Headaches - evaluated by neurology, continue care with Dr. Mickeal Skinner as needed  --headaches improved, occur infrequently  No orders of the defined types were placed in this encounter.  All questions were answered. The patient knows to call the clinic with any problems, questions or concerns.  A total of more than 30 minutes were spent on this encounter and over half of that time was spent on counseling and coordination of care as outlined above.   Ledell Peoples, MD Department of Hematology/Oncology [place] at [facility] Phone: [phone] Pager: [phone] Email: [email or web address]  11/28/2022 10:51 AM

## 2022-11-29 ENCOUNTER — Telehealth: Payer: Self-pay | Admitting: Hematology and Oncology

## 2022-11-29 DIAGNOSIS — S92314A Nondisplaced fracture of first metatarsal bone, right foot, initial encounter for closed fracture: Secondary | ICD-10-CM | POA: Diagnosis not present

## 2022-11-29 NOTE — Telephone Encounter (Signed)
Called patient to schedule f/u. Left voicemail with new appointment information. 

## 2022-12-02 ENCOUNTER — Encounter: Payer: Self-pay | Admitting: Hematology and Oncology

## 2022-12-03 ENCOUNTER — Telehealth: Payer: Self-pay

## 2022-12-03 NOTE — Telephone Encounter (Signed)
1. I left a voicemail with the patient he is aware of his referral to the clinic 1/23, arriving @ 12:30 pm.    2. I discussed the format of the clinic and the physicians he will be seeing that day.   3. I discussed where the clinic is located and how to contact me.   4. I confirmed his address and informed him I would be mailing a packet of information and forms to be completed. I asked him to bring them with him the day of his appointment.

## 2022-12-09 ENCOUNTER — Telehealth: Payer: Self-pay

## 2022-12-09 NOTE — Telephone Encounter (Signed)
Spoke to patient and confirmed appointment for tomorrow also confirmed he did receive packet, he will fill it out and bring it with him

## 2022-12-09 NOTE — Progress Notes (Addendum)
Care Plan Summary  Name: Stuart Gaines DOB: 17-Dec-1955   Your Medical Team:   Urologist -  Dr. Raynelle Bring, [facility]  Radiation Oncologist - Dr. Tyler Pita, [place]     Recommendations: 1) Seed Implant  2) Daily Radiation   3) Surgery   * These recommendations are based on information available as of today's consult.      Recommendations may change depending on the results of further tests or exams.    Next Steps: 1) Consider your options and contact Kathlee Nations, your nurse navigator, with any decisions regarding your treatment decision.     When appointments need to be scheduled, you will be contacted by [facility] and/or [facility].  Questions?  Please do not hesitate to call Katheren Puller, BSN, RN at [phone] with any questions or concerns.  Kathlee Nations is your Oncology Nurse Navigator and is available to assist you while you're receiving your medical care at [facility].

## 2022-12-10 ENCOUNTER — Other Ambulatory Visit: Payer: Self-pay | Admitting: Genetic Counselor

## 2022-12-10 ENCOUNTER — Inpatient Hospital Stay (HOSPITAL_BASED_OUTPATIENT_CLINIC_OR_DEPARTMENT_OTHER): Payer: Medicare Other | Admitting: Genetic Counselor

## 2022-12-10 ENCOUNTER — Other Ambulatory Visit: Payer: Self-pay

## 2022-12-10 ENCOUNTER — Inpatient Hospital Stay: Payer: Medicare Other

## 2022-12-10 ENCOUNTER — Ambulatory Visit
Admission: RE | Admit: 2022-12-10 | Discharge: 2022-12-10 | Disposition: A | Discharge status: Home / Self Care | Payer: Medicare Other | Source: Ambulatory Visit | Attending: Radiation Oncology | Admitting: Radiation Oncology

## 2022-12-10 VITALS — BP 116/77 | HR 98 | Temp 98.1°F | Resp 20 | Ht 70.0 in | Wt 241.8 lb

## 2022-12-10 DIAGNOSIS — Z1379 Encounter for other screening for genetic and chromosomal anomalies: Secondary | ICD-10-CM

## 2022-12-10 DIAGNOSIS — C61 Malignant neoplasm of prostate: Secondary | ICD-10-CM | POA: Diagnosis not present

## 2022-12-10 DIAGNOSIS — Z8041 Family history of malignant neoplasm of ovary: Secondary | ICD-10-CM | POA: Diagnosis not present

## 2022-12-10 DIAGNOSIS — Z800 Family history of malignant neoplasm of digestive organs: Secondary | ICD-10-CM

## 2022-12-10 DIAGNOSIS — Z191 Hormone sensitive malignancy status: Secondary | ICD-10-CM | POA: Diagnosis not present

## 2022-12-10 DIAGNOSIS — Z8042 Family history of malignant neoplasm of prostate: Secondary | ICD-10-CM | POA: Diagnosis not present

## 2022-12-10 DIAGNOSIS — Z8546 Personal history of malignant neoplasm of prostate: Secondary | ICD-10-CM

## 2022-12-10 LAB — GENETIC SCREENING ORDER

## 2022-12-10 NOTE — Consult Note (Signed)
[facility]     12/10/2022   --------------------------------------------------------------------------------   Stuart Gaines  MRN: [id number]  DOB: [birth year], 67 year old Male  SSN: [id number]   PRIMARY CARE:  [name] A. Alyson Ingles, MD  PRIMARY CARE FAX:  [phone]  REFERRING:  Irine Seal, MD  PROVIDER:  Irine Seal, M.D.  TREATING:  Raynelle Bring, M.D.  LOCATION:  [facility] [phone]     --------------------------------------------------------------------------------   CC/HPI: CC: Prostate Cancer   Physician requesting consult: Dr. Irine Seal  PCP: Dr. Maury Dus  Location of consult: [facility]   Stuart Gaines is a 67 year old gentleman who was found to have an elevated PSA of 8.04 prompting an MRI of the prostate on 10/13/22. This demonstrated a PI-RADS 5 lesion of the right anterior transition zone of the prostate (mid/apex) with bulging of the capsule in this area suggestive of EPE. He underwent an MR/US fusion biopsy on 11/27/22 that confirmed 3 out of 3 targeted MR biopsies to be positive for Gleason 4+3=7 adenocarcinoma with all systemic biopsies negative for malignancy.   Family history: None.   Imaging studies: MRI (10/13/22) - No EPE, SVI, or LAD, or bone lesions.   PMH: He has a history of chronic pain (on hydrocodone), hypertension, GERD, hyperlipidemia, asthma, and liver disease (hemochromotosis).  PSH: Laparoscopic cholecystectomy and open appendectomy.   TNM stage: cT3a Nx Mx  PSA: 8.04  Gleason score: 4+3=7 (GG 3)  Biopsy (11/27/22): 3/15 cores positive  Left: Benign  Right: Benign  MR target: 3/3 cores (4+3=7, 80%, 30%, 30%, PNI)  Prostate volume: 31.0 cc   Nomogram  OC disease: 51%  EPE: 45%  SVI: 6%  LNI: 8%  PFS (5 year, 10 year): 65%, 50%   Urinary function: IPSS is 7.  Erectile function: SHIM score is 5. He has not been sexually active since the late  90s when he got divorced. However, he feels that his erectile function is quite good.     ALLERGIES: Ampicillin - sweating    MEDICATIONS: Lisinopril  Omeprazole  Amlodipine Besilate  Atorvastatin Calcium  Diclofenac  Hydrocodone-Acetaminophen  Ventolin Hfa     GU PSH: ESWL - about 2006 Prostate Needle Biopsy - 11/27/2022       PSH Notes: ganglion cyst     NON-GU PSH: Appendectomy Foot/toes Surgery Procedure Forearm Or Wrist Surgery Incise Gallbladder Knee Arthroscopy/surgery Low Back Disk Surgery Remove Gallbladder Surgical Pathology, Gross And Microscopic Examination For Prostate Needle - 11/27/2022     GU PMH: Elevated PSA - 11/27/2022, His PSA is up further but his exam remains benign. I am going to get an MRIP on him and if it is positive, he will return for a fusion biopsy, but if negative he will need a standard biopsy. I reviewed the risks of bleeding, infection and voiding difficulty. He has had prior issues with quinolones so I will send bactrim for the prep, but he will need Rocephin as well. , - 09/16/2022, His PSA is down slightly to 3.91. I will just have him return in 6 months with a PSA for an exam. I will get a testosterone as well since he is on the chronic pain med. , - 08/02/2020, His PSA is up to 4.19 but it was 3.79 in 2019 and his exam is benign. I am going to have him return in 51month with a repeat PSA and if it is still rising, I will  set him up for a biopsy. I reviewed the risks of bleeding, infection and voiding difficulty. I also discussed the ExoDx test which could give [place] more information about his risk level. , - 2021 BPH w/o LUTS - 09/16/2022 BPH w/LUTS, He has mild LUTS but no need for treatment. - 08/02/2020 Nocturia - 08/02/2020      PMH Notes: Kidney Stone   NON-GU PMH: Arthritis Asthma GERD Hypercholesterolemia Hypertension Liver Disease Other chronic pain Other hemochromatosis    FAMILY HISTORY: 1 Daughter - Other 1 son -  Other Cancer - Father, Brother Diabetes - Runs in Family Hematuria - Father Strokes - Mother, Father   SOCIAL HISTORY: Marital Status: Divorced Preferred Language: English Current Smoking Status: Patient smokes. Has smoked since 04/19/1975.   Tobacco Use Assessment Completed: Used Tobacco in last 30 days? Has never drank.  Does not use drugs. Drinks 2 caffeinated drinks per day. Patient's occupation is/was Disabled.    REVIEW OF SYSTEMS:    GU Review Male:   Patient denies frequent urination, hard to postpone urination, burning/ pain with urination, get up at night to urinate, leakage of urine, stream starts and stops, trouble starting your streams, and have to strain to urinate .  Gastrointestinal (Upper):   Patient denies nausea and vomiting.  Gastrointestinal (Lower):   Patient denies diarrhea and constipation.  Constitutional:   Patient denies fever, night sweats, weight loss, and fatigue.  Skin:   Patient denies skin rash/ lesion and itching.  Eyes:   Patient denies blurred vision and double vision.  Ears/ Nose/ Throat:   Patient denies sore throat and sinus problems.  Hematologic/Lymphatic:   Patient denies swollen glands and easy bruising.  Cardiovascular:   Patient denies leg swelling and chest pains.  Respiratory:   Patient denies cough and shortness of breath.  Endocrine:   Patient denies excessive thirst.  Musculoskeletal:   Patient denies back pain and joint pain.  Neurological:   Patient denies headaches and dizziness.  Psychologic:   Patient denies depression and anxiety.   VITAL SIGNS: None   MULTI-SYSTEM PHYSICAL EXAMINATION:    Constitutional: Well-nourished. No physical deformities. Normally developed. Good grooming.     Complexity of Data:  Lab Test Review:   PSA  Records Review:   Pathology Reports, Previous Patient Records   09/09/22 03/06/22 07/26/20 03/24/20  PSA  Total PSA 8.04 ng/ml 6.03 ng/ml 3.91 ng/mL 4.19 ng/dl    PROCEDURES: None    ASSESSMENT:      ICD-10 Details  1 GU:   Prostate Cancer - C61    PLAN:           Orders X-Rays: PET- PSMA Scan - Locally advanced prostate cancer          Schedule         Document Letter(s):  Created for Patient: Clinical Summary         Notes:   1. Locally advanced prostate cancer: I had a detailed discussion with Stuart Gaines and his son today regarding his prostate cancer situation. He does appear to potentially locally advanced disease based on his MRI results which suggest possible extraprostatic extension. As such, I have recommended proceeding with staging with a PSMA PET scan. This will be scheduled. Assuming this is negative for metastatic disease, we have reviewed options for treatment of at least unfavorable intermediate risk and what appears to be high risk, locally advanced disease.   The patient was counseled about the natural history of prostate cancer and the  standard treatment options that are available for prostate cancer. It was explained to him how his age and life expectancy, clinical stage, Gleason score/prognostic grade group, and PSA (and PSA density) affect his prognosis, the decision to proceed with additional staging studies, as well as how that information influences recommended treatment strategies. We discussed the roles for active surveillance, radiation therapy, surgical therapy, androgen deprivation, as well as ablative therapy and other investigational options for the treatment of prostate cancer as appropriate to his individual cancer situation. We discussed the risks and benefits of these options with regard to their impact on cancer control and also in terms of potential adverse events, complications, and impact on quality of life particularly related to urinary and sexual function. The patient was encouraged to ask questions throughout the discussion today and all questions were answered to his stated satisfaction. In addition, the patient was provided  with and/or directed to appropriate resources and literature for further education about prostate cancer and treatment options. We discussed surgical therapy for prostate cancer including the different available surgical approaches. We discussed, in detail, the risks and expectations of surgery with regard to cancer control, urinary control, and erectile function as well as the expected postoperative recovery process. Additional risks of surgery including but not limited to bleeding, infection, hernia formation, nerve damage, lymphocele formation, bowel/rectal injury potentially necessitating colostomy, damage to the urinary tract resulting in urine leakage, urethral stricture, and the cardiopulmonary risks such as myocardial infarction, stroke, death, venothromboembolism, etc. were explained. The risk of open surgical conversion for robotic/laparoscopic prostatectomy was also discussed.   He is scheduled to meet with Dr. Tammi Klippel later this afternoon to discuss radiotherapy options. My surgical plan would be to consider a unilateral left nerve sparing robot-assisted laparoscopic radical prostatectomy and bilateral pelvic lymphadenectomy. Sexual function is a low priority. I would have him return for a preoperative visit and exam prior to surgery.   CC: Dr. Irine Seal  Dr. Tyler Pita

## 2022-12-10 NOTE — Progress Notes (Signed)
Radiation Oncology         [phone] ________________________________  Multidisciplinary Prostate Cancer Clinic  Initial Radiation Oncology Consultation  Name: Stuart Gaines MRN: [id number]  Date: 12/10/2022  DOB: 05/27/1956  FM:[id number], [name], MD (Inactive)  Irine Seal, MD   REFERRING PHYSICIAN: Irine Seal, MD  DIAGNOSIS: 67 y.o. gentleman with stage T1c adenocarcinoma of the prostate with a Gleason's score of 4+3 and a PSA of 8.04.    ICD-10-CM   1. Malignant neoplasm of prostate ([place])  C61       HISTORY OF PRESENT ILLNESS::Stuart Gaines is a 67 y.o. gentleman with a history of hereditary hemachromatosis, followed by Dr. Lorenso Courier. He was initially referred for evaluation in urology by Dr. Jeffie Pollock in 04/2020 for an elevated PSA of 4.19. This was felt to possibly be associated with prostatitis so he was treated with two weeks of Cipro, and his PSA declined to 3.91 in 07/2020. He continued to follow his PSA with his PCP. More recently, he was noted to have a further rise in his PSA, to 6.03 in 02/2022 and 8.04 in 08/2022. He was referred back to Dr. Jeffie Pollock on 09/16/22, digital rectal examination performed at that time was normal. He underwent prostate MRI on 10/13/22 which showed a PI-RADS 5 lesion in the right anterior transition zone and right anterior fibromuscular stroma from mid gland to apex, with minimal bulging of prostate contour that is suspicious for extracapsular extension.     The patient proceeded to MRI fusion biopsy of the prostate on 11/27/22.  The prostate volume measured 31 cc.  Out of 15 core biopsies, 3 were positive.  The maximum Gleason score was 4+3, and this was seen in all three samples from the MRI ROI lesion (with perineural invasion). All 12 standard cores were benign.    The patient reviewed the biopsy results with his urologist and he has kindly been referred today to the multidisciplinary prostate cancer clinic for presentation of pathology and radiology  studies in our conference for discussion of potential radiation treatment options and clinical evaluation.   PREVIOUS RADIATION THERAPY: No  PAST MEDICAL HISTORY:  has a past medical history of High cholesterol and Hypertension.    PAST SURGICAL HISTORY: Past Surgical History:  Procedure Laterality Date   BACK SURGERY     CHOLECYSTECTOMY      FAMILY HISTORY: family history includes Anemia in his father and mother; Cancer in his maternal grandfather; Diabetes in his brother; Ovarian cancer in his paternal aunt; Pancreatic cancer in his paternal uncle; Prostate cancer in his paternal uncle; Stroke in his father and paternal grandmother; Throat cancer in his father.  SOCIAL HISTORY:  reports that he quit smoking about 49 years ago. His smoking use included cigarettes. He has a 0.50 pack-year smoking history. He does not have any smokeless tobacco history on file. He reports that he does not drink alcohol.  ALLERGIES: Duloxetine hcl, Lisinopril, Oxycodone, Oxycodone hcl, Pregabalin, Tramadol hcl, and Ampicillin  MEDICATIONS:  Current Outpatient Medications  Medication Sig Dispense Refill   amLODipine (NORVASC) 5 MG tablet Take 5 mg by mouth every morning.     atorvastatin (LIPITOR) 40 MG tablet Take 40 mg by mouth at bedtime.     HYDROcodone-acetaminophen (NORCO) 7.5-325 MG per tablet Take 1 tablet by mouth 2 (two) times daily as needed (for pain).      lisinopril (ZESTRIL) 5 MG tablet Take 5 mg by mouth every morning.     Multiple Vitamins-Minerals (MULTIVITAMIN GUMMIES ADULT)  CHEW See admin instructions.     omeprazole (PRILOSEC) 20 MG capsule Take 20 mg by mouth daily.     VENTOLIN HFA 108 (90 BASE) MCG/ACT inhaler Inhale 1-2 puffs into the lungs as needed.      No current facility-administered medications for this encounter.    REVIEW OF SYSTEMS:  On review of systems, the patient reports that he is doing well overall. He denies any chest pain, shortness of breath, cough, fevers,  chills, night sweats, unintended weight changes. He denies any bowel disturbances, and denies abdominal pain, nausea or vomiting. He denies any new musculoskeletal or joint aches or pains. His IPSS was 7, indicating mild urinary symptoms. His SHIM was 5, indicating he has severe erectile dysfunction. A complete review of systems is obtained and is otherwise negative.   PHYSICAL EXAM:  Wt Readings from Last 3 Encounters:  12/10/22 241 lb 12.8 oz (109.7 kg)  11/28/22 236 lb 1.6 oz (107.1 kg)  05/29/22 236 lb 6.4 oz (107.2 kg)   Temp Readings from Last 3 Encounters:  12/10/22 98.1 F (36.7 C)  11/28/22 98.8 F (37.1 C)  05/29/22 (!) 97.5 F (36.4 C) (Oral)   BP Readings from Last 3 Encounters:  12/10/22 116/77  11/28/22 (!) 143/80  05/29/22 (!) 144/78   Pulse Readings from Last 3 Encounters:  12/10/22 98  11/28/22 (!) 104  05/29/22 83    /10  In general this is a well appearing Caucasian man in no acute distress. He's alert and oriented x4 and appropriate throughout the examination. Cardiopulmonary assessment is negative for acute distress and he exhibits normal effort.    KPS = 100  100 - Normal; no complaints; no evidence of disease. 90   - Able to carry on normal activity; minor signs or symptoms of disease. 80   - Normal activity with effort; some signs or symptoms of disease. 12   - Cares for self; unable to carry on normal activity or to do active work. 60   - Requires occasional assistance, but is able to care for most of his personal needs. 50   - Requires considerable assistance and frequent medical care. 81   - Disabled; requires special care and assistance. 60   - Severely disabled; hospital admission is indicated although death not imminent. 64   - Very sick; hospital admission necessary; active supportive treatment necessary. 10   - Moribund; fatal processes progressing rapidly. 0     - Dead  Karnofsky DA, Abelmann Buckner, Craver LS and Burchenal [facility] [phone]) The use of  the nitrogen mustards in the palliative treatment of carcinoma: with particular reference to bronchogenic carcinoma Cancer 1 634-56   LABORATORY DATA:  Lab Results  Component Value Date   WBC 8.4 11/28/2022   HGB 16.3 11/28/2022   HCT 45.1 11/28/2022   MCV 90.9 11/28/2022   PLT 267 11/28/2022   Lab Results  Component Value Date   NA 139 11/28/2022   K 3.8 11/28/2022   CL 104 11/28/2022   CO2 25 11/28/2022   Lab Results  Component Value Date   ALT 35 11/28/2022   AST 24 11/28/2022   ALKPHOS 127 (H) 11/28/2022   BILITOT 0.9 11/28/2022     RADIOGRAPHY: CT FOOT RIGHT WO CONTRAST  Result Date: 12/13/2022 CLINICAL DATA:  Right foot pain status post injury 10/29/2022 EXAM: CT OF THE RIGHT FOOT WITHOUT CONTRAST TECHNIQUE: Multidetector CT imaging of the right foot was performed according to the standard protocol. Multiplanar CT image  reconstructions were also generated. RADIATION DOSE REDUCTION: This exam was performed according to the departmental dose-optimization program which includes automated exposure control, adjustment of the mA and/or kV according to patient size and/or use of iterative reconstruction technique. COMPARISON:  None Available. FINDINGS: Bones/Joint/Cartilage No fracture or dislocation. Normal alignment. No joint effusion. Severe osteoarthritis of the first, second and third tarsometatarsal joints with subchondral cystic changes and marginal osteophytes. Moderate osteoarthritis of the fourth and fifth tarsometatarsal joints. Mild osteoarthritis of the first MTP joint and first IP joint. Ankle mortise is intact. Small bony fragments adjacent to the medial malleolus consistent with sequela of prior genu shunt op you making twitch No and told she was sleeping likely os lie in a have a injury. Small loose body along the anteromedial aspect of the posterior subtalar joint. Enthesopathic changes of the Achilles tendon insertion. Ligaments Ligaments are suboptimally evaluated by  CT. Muscles and Tendons Muscles are normal. No muscle atrophy. No intramuscular fluid collection or hematoma. Flexor, extensor, peroneal and Achilles tendons are intact. Soft tissue No fluid collection or hematoma.  No soft tissue mass. IMPRESSION: 1. No acute osseous injury of the right foot. 2. Severe osteoarthritis of the first, second and third tarsometatarsal joints. Moderate osteoarthritis of the fourth and fifth tarsometatarsal joints. 3. Mild osteoarthritis of the first MTP joint and first IP joint. Electronically Signed   By: Kathreen Devoid M.D.   On: 12/13/2022 07:24      IMPRESSION/PLAN: 67 y.o. gentleman with Stage T1c adenocarcinoma of the prostate with a Gleason score of 4+3 and a PSA of 8.04.    We discussed the patient's workup and outlined the nature of prostate cancer in this setting. The patient's T stage, Gleason's score, and PSA put him into the unfavorable intermediate risk group. Given the MRI findings that are suspicious for possible extracapsular disease, we do recommend further evaluation with PSMA PET scan to confirm whether he has locally advanced or not. Pending the PSMA PET imaging does not show locally advanced disease, he is eligible for a variety of potential treatment options including brachytherapy, 5.5 weeks of external radiation or prostatectomy. We discussed the available radiation techniques, and focused on the details and logistics of delivery. We discussed and outlined the risks, benefits, short and long-term effects associated with radiotherapy and compared and contrasted these with prostatectomy. We discussed the role of SpaceOAR gel in reducing the rectal toxicity associated with radiotherapy. We did discuss the role of ADT in the treatment of locally advanced or high risk prostate cancer and outlined the associated side effects that could be expected with this therapy. Should the PSMA PET scan show evidence of locally advanced disease, our treatment recommendation would  then involve LT-ADT concurrent with either an 8 week course of daily external beam radiation or combination therapy with an upfront brachytherapy seed boost followed by 5 weeks of daily external beam radiation He appears to have a good understanding of his disease and our treatment recommendations which are of curative intent.  He was encouraged to ask questions that were answered to his stated satisfaction.  At the end of the conversation the patient remains undecided regarding his treatment preference and is in favor of proceeding with the PSMA PET scan to confirm disease staging prior to committing to any form of treatment. Our discussion will be shared with Dr. Jeffie Pollock so that he can help coordinate for the PSMA PET, first available. We will watch for those results and await the patient's decision. He has  our contact information and will either reach out to [place] or Katheren Puller, RN navigator, with his final decision so that we can move forward with treatment planning accordingly. We enjoyed meeting him today and look forward to continuing to participate in his care.  We personally spent 60 minutes in this encounter including chart review, reviewing radiological studies, meeting face-to-face with the patient, entering orders and completing documentation.    Nicholos Johns, PA-C    Tyler Pita, MD  [facility] Direct Dial: [phone]  Fax: [phone] [email or web address]  Skype  [email or web address]   This document serves as a record of services personally performed by Tyler Pita, MD and Freeman Caldron, PA-C. It was created on their behalf by Wilburn Mylar, a trained medical scribe. The creation of this record is based on the scribe's personal observations and the provider's statements to them. This document has been checked and approved by the attending provider.

## 2022-12-11 ENCOUNTER — Encounter: Payer: Self-pay | Admitting: Genetic Counselor

## 2022-12-11 ENCOUNTER — Encounter: Payer: Self-pay | Admitting: Hematology and Oncology

## 2022-12-11 NOTE — Progress Notes (Signed)
REFERRING PROVIDER: Tyler Pita, MD  PRIMARY PROVIDER:  Maury Dus, MD (Inactive)  PRIMARY REASON FOR VISIT:  1. Personal history of prostate cancer   2. Family history of ovarian cancer   3. Family history of pancreatic cancer   4. Family history of prostate cancer    HISTORY OF PRESENT ILLNESS:   Stuart Gaines, a 67 y.o. male, was seen for a [place] cancer genetics consultation at the request of Dr. Tammi Klippel due to a personal and family history of cancer.  Stuart Gaines presents to clinic today to discuss the possibility of a hereditary predisposition to cancer, to discuss genetic testing, and to further clarify his future cancer risks, as well as potential cancer risks for family members.   Stuart Gaines was diagnosed with prostate cancer at age 62, Gleason score 7.  Past Medical History:  Diagnosis Date   High cholesterol    Hypertension     Past Surgical History:  Procedure Laterality Date   BACK SURGERY     CHOLECYSTECTOMY      Social History   Socioeconomic History   Marital status: Divorced    Spouse name: Not on file   Number of children: 2   Years of education: Not on file   Highest education level: Not on file  Occupational History   Not on file  Tobacco Use   Smoking status: Former    Packs/day: 1.00    Years: 0.50    Total pack years: 0.50    Types: Cigarettes    Quit date: 77    Years since quitting: 49.0   Smokeless tobacco: Not on file  Substance and Sexual Activity   Alcohol use: No   Drug use: Not on file   Sexual activity: Not on file  Other Topics Concern   Not on file  Social History Narrative   Not on file   Social Determinants of Health   Financial Resource Strain: Not on file  Food Insecurity: Not on file  Transportation Needs: Not on file  Physical Activity: Not on file  Stress: Not on file  Social Connections: Not on file     FAMILY HISTORY:  We obtained a detailed, 4-generation family history.  Significant diagnoses  are listed below: Family History  Problem Relation Age of Onset   Anemia Mother    Stroke Father    Anemia Father    Throat cancer Father    Diabetes Brother    Ovarian cancer Paternal Aunt    Prostate cancer Paternal Uncle    Pancreatic cancer Paternal Uncle    Cancer Maternal Grandfather    Stroke Paternal Grandmother      Stuart Gaines father was diagnosed with throat cancer at an unknown age, he smoked and died at age 46. He had three paternal aunts and one was diagnosed with ovarian cancer, she is deceased. He has four paternal uncles. One uncle was diagnosed with prostate cancer and one was diagnosed with pancreatic cancer, they are deceased. His paternal grandfather was diagnosed with an unknown type of cancer, he is deceased. Stuart Gaines is unaware of previous family history of genetic testing for hereditary cancer risks. There is no reported Ashkenazi Jewish ancestry.   GENETIC COUNSELING ASSESSMENT: Stuart Gaines is a 67 y.o. male with a personal and family history of cancer which is somewhat suggestive of a hereditary predisposition to cancer. We, therefore, discussed and recommended the following at today's visit.   DISCUSSION: We discussed that 5 - 10% of cancer  is hereditary, with most cases of prostate cancer associated with BRCA1/2.  There are other genes that can be associated with hereditary prostate cancer syndromes.  We discussed that testing is beneficial for several reasons including knowing how to follow individuals after completing their treatment, identifying whether potential treatment options would be beneficial, and understanding if other family members could be at risk for cancer and allowing them to undergo genetic testing.   We reviewed the characteristics, features and inheritance patterns of hereditary cancer syndromes. We also discussed genetic testing, including the appropriate family members to test, the process of testing, insurance coverage and  turn-around-time for results. We discussed the implications of a negative, positive, carrier and/or variant of uncertain significant result. We recommended Stuart Gaines pursue genetic testing for a panel that includes genes associated with prostate, ovarian, and pancreatic cancer.   Stuart Gaines elected to have [facility] Common Cancer Panel. The Common Hereditary Cancers Panel offered by [facility] includes sequencing and/or deletion duplication testing of the following 48 genes: APC, ATM, AXIN2, BAP1, BARD1, BMPR1A, BRCA1, BRCA2, BRIP1, CDH1, CDK4, CDKN2A (p14ARF and p16INK4a only), CHEK2, CTNNA1, DICER1, EPCAM (Deletion/duplication testing only), FH, GREM1 (promoter region duplication testing only), HOXB13, KIT, MBD4, MEN1, MLH1, MSH2, MSH3, MSH6, MUTYH, NF1, NHTL1, PALB2, PDGFRA, PMS2, POLD1, POLE, PTEN, RAD51C, RAD51D, SDHA (sequencing analysis only except exon 14), SDHB, SDHC, SDHD, SMAD4, SMARCA4. STK11, TP53, TSC1, TSC2, and VHL.  Based on Stuart Gaines personal and family history of cancer, he meets medical criteria for genetic testing. Despite that he meets criteria, he may still have an out of pocket cost. We discussed that if his out of pocket cost for testing is over $100, the laboratory will call and confirm whether he wants to proceed with testing.  If the out of pocket cost of testing is less than $100 he will be billed by the genetic testing laboratory.   PLAN: After considering the risks, benefits, and limitations, Stuart Gaines provided informed consent to pursue genetic testing and the blood sample was sent to [facility] for analysis of the Common Cancer Panel. Results should be available within approximately 2-3 weeks' time, at which point they will be disclosed by telephone to Stuart Gaines, as will any additional recommendations warranted by these results. Stuart Gaines will receive a summary of his genetic counseling visit and a copy of his results once available. This information will also  be available in Epic.   Stuart Gaines questions were answered to his satisfaction today. Our contact information was provided should additional questions or concerns arise. Thank you for the referral and allowing [place] to share in the care of your patient.   Lucille Passy, MS, [facility] Genetic Counselor [email or web address] (P) [phone]  The patient was seen for a total of 20 minutes in face-to-face genetic counseling.  The patient brought his wife. Drs. Lindi Adie and/or Burr Medico were available to discuss this case as needed.   _______________________________________________________________________ For Office Staff:  Number of people involved in session: 2 Was an Intern/ student involved with case: no

## 2022-12-12 ENCOUNTER — Ambulatory Visit
Admission: RE | Admit: 2022-12-12 | Discharge: 2022-12-12 | Disposition: A | Discharge status: Home / Self Care | Payer: Medicare Other | Source: Ambulatory Visit | Attending: Orthopedic Surgery | Admitting: Orthopedic Surgery

## 2022-12-12 DIAGNOSIS — M79671 Pain in right foot: Secondary | ICD-10-CM

## 2022-12-13 DIAGNOSIS — C61 Malignant neoplasm of prostate: Secondary | ICD-10-CM | POA: Insufficient documentation

## 2022-12-16 NOTE — Progress Notes (Signed)
Patient presented to the [facility] on 12/10/22 for his stage T1c adenocarcinoma of the prostate with a Gleason's score of 4+3 and a PSA of 8.04.   RN spoke with patient and he remains undecided on his treatment decision at this time, however, is leaning towards surgery.  Patient will see his orthopedist on 2/2 for evaluation of right foot due to recent injury.  Patient would like to get plan of care established for his foot prior to confirming decision.  Pt agreeable for RN to follow up on 2/5 to reassess.    No additional questions/barriers at this time.  Will continue to follow.

## 2022-12-20 DIAGNOSIS — M19071 Primary osteoarthritis, right ankle and foot: Secondary | ICD-10-CM | POA: Diagnosis not present

## 2022-12-20 DIAGNOSIS — M6701 Short Achilles tendon (acquired), right ankle: Secondary | ICD-10-CM | POA: Diagnosis not present

## 2022-12-23 NOTE — Progress Notes (Signed)
RN spoke with patient and he has confirmed he would like to proceed with radical prostatectomy.  MD's aware of treatment decision.    PSMA PET scan still pending authorization/scheduling at this time.  Pt aware of status and verbalized understanding that based on findings recommendations may change.    Will continue to follow.

## 2022-12-26 DIAGNOSIS — M79672 Pain in left foot: Secondary | ICD-10-CM | POA: Diagnosis not present

## 2022-12-26 NOTE — Progress Notes (Signed)
RN contacted [facility] to get update on PSMA PET scan that was ordered by Dr. Alinda Money.    Recently was awaiting authorization.  [place] department awaiting order/authorization from [facility].    MD notified, will continue to follow to ensure PSMA PET gets scheduled.

## 2022-12-30 ENCOUNTER — Encounter (HOSPITAL_BASED_OUTPATIENT_CLINIC_OR_DEPARTMENT_OTHER): Payer: Self-pay | Admitting: Orthopedic Surgery

## 2022-12-30 ENCOUNTER — Telehealth: Payer: Self-pay | Admitting: Genetic Counselor

## 2022-12-30 ENCOUNTER — Other Ambulatory Visit (HOSPITAL_COMMUNITY): Payer: Self-pay | Admitting: Orthopedic Surgery

## 2022-12-30 ENCOUNTER — Encounter: Payer: Self-pay | Admitting: Genetic Counselor

## 2022-12-30 DIAGNOSIS — Z1379 Encounter for other screening for genetic and chromosomal anomalies: Secondary | ICD-10-CM | POA: Insufficient documentation

## 2022-12-30 NOTE — Telephone Encounter (Signed)
I contacted [name] to discuss his genetic testing results. No pathogenic variants were identified in the 48 genes analyzed. Detailed clinic note to follow.  The test report has been scanned into EPIC and is located under the Molecular Pathology section of the Results Review tab.  A portion of the result report is included below for reference.   Stuart Passy, MS, [facility] Genetic Counselor [email or web address] (P) [phone]

## 2022-12-31 ENCOUNTER — Encounter (HOSPITAL_BASED_OUTPATIENT_CLINIC_OR_DEPARTMENT_OTHER)
Admission: RE | Admit: 2022-12-31 | Discharge: 2022-12-31 | Disposition: A | Discharge status: Home / Self Care | Payer: Medicare Other | Source: Ambulatory Visit | Attending: Orthopedic Surgery | Admitting: Orthopedic Surgery

## 2022-12-31 DIAGNOSIS — Z01810 Encounter for preprocedural cardiovascular examination: Secondary | ICD-10-CM | POA: Diagnosis not present

## 2022-12-31 NOTE — Progress Notes (Signed)
RN received notification from [facility] that approval on PSMA PET scan has been obtained and order faxed to nuclear med department.   RN left voicemail with nuc med to get PSMA PET scan scheduled.  Pending call back at this time.

## 2022-12-31 NOTE — Progress Notes (Signed)

## 2023-01-01 ENCOUNTER — Other Ambulatory Visit (HOSPITAL_COMMUNITY): Payer: Self-pay | Admitting: Urology

## 2023-01-01 DIAGNOSIS — C61 Malignant neoplasm of prostate: Secondary | ICD-10-CM

## 2023-01-01 DIAGNOSIS — M6281 Muscle weakness (generalized): Secondary | ICD-10-CM | POA: Diagnosis not present

## 2023-01-02 ENCOUNTER — Encounter (HOSPITAL_BASED_OUTPATIENT_CLINIC_OR_DEPARTMENT_OTHER): Payer: Self-pay | Admitting: Orthopedic Surgery

## 2023-01-02 ENCOUNTER — Ambulatory Visit (HOSPITAL_BASED_OUTPATIENT_CLINIC_OR_DEPARTMENT_OTHER): Payer: Medicare Other

## 2023-01-02 ENCOUNTER — Other Ambulatory Visit: Payer: Self-pay

## 2023-01-02 ENCOUNTER — Encounter (HOSPITAL_BASED_OUTPATIENT_CLINIC_OR_DEPARTMENT_OTHER)
Admission: RE | Disposition: A | Discharge status: Home / Self Care | Payer: Self-pay | Source: Ambulatory Visit | Attending: Orthopedic Surgery

## 2023-01-02 ENCOUNTER — Ambulatory Visit (HOSPITAL_BASED_OUTPATIENT_CLINIC_OR_DEPARTMENT_OTHER): Payer: Medicare Other | Admitting: Anesthesiology

## 2023-01-02 ENCOUNTER — Ambulatory Visit (HOSPITAL_BASED_OUTPATIENT_CLINIC_OR_DEPARTMENT_OTHER)
Admission: RE | Admit: 2023-01-02 | Discharge: 2023-01-02 | Disposition: A | Discharge status: Home / Self Care | Payer: Medicare Other | Source: Ambulatory Visit | Attending: Orthopedic Surgery | Admitting: Orthopedic Surgery

## 2023-01-02 DIAGNOSIS — M19071 Primary osteoarthritis, right ankle and foot: Secondary | ICD-10-CM

## 2023-01-02 DIAGNOSIS — K219 Gastro-esophageal reflux disease without esophagitis: Secondary | ICD-10-CM | POA: Diagnosis not present

## 2023-01-02 DIAGNOSIS — Z87891 Personal history of nicotine dependence: Secondary | ICD-10-CM | POA: Insufficient documentation

## 2023-01-02 DIAGNOSIS — G8918 Other acute postprocedural pain: Secondary | ICD-10-CM | POA: Diagnosis not present

## 2023-01-02 DIAGNOSIS — M6701 Short Achilles tendon (acquired), right ankle: Secondary | ICD-10-CM | POA: Insufficient documentation

## 2023-01-02 DIAGNOSIS — Z01818 Encounter for other preprocedural examination: Secondary | ICD-10-CM

## 2023-01-02 DIAGNOSIS — I10 Essential (primary) hypertension: Secondary | ICD-10-CM | POA: Diagnosis not present

## 2023-01-02 HISTORY — PX: FOOT ARTHRODESIS: SHX1655

## 2023-01-02 HISTORY — PX: GASTROCNEMIUS RECESSION: SHX863

## 2023-01-02 HISTORY — DX: Other chronic pain: G89.29

## 2023-01-02 SURGERY — FUSION, JOINT, FOOT
Anesthesia: Regional | Site: Leg Lower | Laterality: Right

## 2023-01-02 MED ORDER — BUPIVACAINE HCL (PF) 0.25 % IJ SOLN
INTRAMUSCULAR | Status: DC | PRN
  Administered 2023-01-02 (×2): 15 mL via PERINEURAL

## 2023-01-02 MED ORDER — ACETAMINOPHEN 500 MG PO TABS
ORAL_TABLET | ORAL | Status: AC
  Filled 2023-01-02: qty 2

## 2023-01-02 MED ORDER — MIDAZOLAM HCL 2 MG/2ML IJ SOLN
1.0000 mg | Freq: Once | INTRAMUSCULAR | Status: AC
  Administered 2023-01-02: 1 mg via INTRAVENOUS

## 2023-01-02 MED ORDER — ACETAMINOPHEN 500 MG PO TABS
1000.0000 mg | ORAL_TABLET | Freq: Once | ORAL | Status: AC
  Administered 2023-01-02: 1000 mg via ORAL

## 2023-01-02 MED ORDER — BUPIVACAINE LIPOSOME 1.3 % IJ SUSP
INTRAMUSCULAR | Status: DC | PRN
  Administered 2023-01-02: 10 mL via PERINEURAL

## 2023-01-02 MED ORDER — HYDROMORPHONE HCL 2 MG PO TABS: 2.0000 mg | ORAL_TABLET | Freq: Four times a day (QID) | ORAL | 0 refills | Status: AC | PRN

## 2023-01-02 MED ORDER — FENTANYL CITRATE (PF) 100 MCG/2ML IJ SOLN: 25.0000 ug | INTRAMUSCULAR | Status: DC | PRN

## 2023-01-02 MED ORDER — LIDOCAINE HCL (CARDIAC) PF 100 MG/5ML IV SOSY
PREFILLED_SYRINGE | INTRAVENOUS | Status: DC | PRN
  Administered 2023-01-02: 20 mg via INTRAVENOUS

## 2023-01-02 MED ORDER — AMISULPRIDE (ANTIEMETIC) 5 MG/2ML IV SOLN: 10.0000 mg | Freq: Once | INTRAVENOUS | Status: DC | PRN

## 2023-01-02 MED ORDER — FENTANYL CITRATE (PF) 100 MCG/2ML IJ SOLN
INTRAMUSCULAR | Status: AC
  Filled 2023-01-02: qty 2

## 2023-01-02 MED ORDER — VANCOMYCIN HCL 500 MG IV SOLR
INTRAVENOUS | Status: DC | PRN
  Administered 2023-01-02: 500 mg via TOPICAL

## 2023-01-02 MED ORDER — KETOROLAC TROMETHAMINE 15 MG/ML IJ SOLN
15.0000 mg | Freq: Once | INTRAMUSCULAR | Status: AC | PRN
  Administered 2023-01-02: 15 mg via INTRAVENOUS

## 2023-01-02 MED ORDER — CEFAZOLIN SODIUM-DEXTROSE 2-4 GM/100ML-% IV SOLN
2.0000 g | INTRAVENOUS | Status: AC
  Administered 2023-01-02: 2 g via INTRAVENOUS

## 2023-01-02 MED ORDER — CEFAZOLIN SODIUM-DEXTROSE 2-4 GM/100ML-% IV SOLN
INTRAVENOUS | Status: AC
  Filled 2023-01-02: qty 100

## 2023-01-02 MED ORDER — ONDANSETRON HCL 4 MG/2ML IJ SOLN: 4.0000 mg | Freq: Once | INTRAMUSCULAR | Status: DC | PRN

## 2023-01-02 MED ORDER — KETOROLAC TROMETHAMINE 30 MG/ML IJ SOLN
INTRAMUSCULAR | Status: AC
  Filled 2023-01-02: qty 1

## 2023-01-02 MED ORDER — DEXAMETHASONE SODIUM PHOSPHATE 10 MG/ML IJ SOLN
INTRAMUSCULAR | Status: DC | PRN
  Administered 2023-01-02: 4 mg via INTRAVENOUS

## 2023-01-02 MED ORDER — EPHEDRINE 5 MG/ML INJ
INTRAVENOUS | Status: AC
  Filled 2023-01-02: qty 5

## 2023-01-02 MED ORDER — PROPOFOL 10 MG/ML IV BOLUS
INTRAVENOUS | Status: AC
  Filled 2023-01-02: qty 20

## 2023-01-02 MED ORDER — LACTATED RINGERS IV SOLN: INTRAVENOUS | Status: DC

## 2023-01-02 MED ORDER — RIVAROXABAN 10 MG PO TABS: 10.0000 mg | ORAL_TABLET | Freq: Every day | ORAL | 0 refills | Status: DC

## 2023-01-02 MED ORDER — SODIUM CHLORIDE 0.9 % IV SOLN: INTRAVENOUS | Status: DC

## 2023-01-02 MED ORDER — PROPOFOL 10 MG/ML IV BOLUS
INTRAVENOUS | Status: DC | PRN
  Administered 2023-01-02: 200 mg via INTRAVENOUS

## 2023-01-02 MED ORDER — 0.9 % SODIUM CHLORIDE (POUR BTL) OPTIME
TOPICAL | Status: DC | PRN
  Administered 2023-01-02: 300 mL

## 2023-01-02 MED ORDER — FENTANYL CITRATE (PF) 100 MCG/2ML IJ SOLN
INTRAMUSCULAR | Status: DC | PRN
  Administered 2023-01-02 (×4): 25 ug via INTRAVENOUS

## 2023-01-02 MED ORDER — PROPOFOL 500 MG/50ML IV EMUL
INTRAVENOUS | Status: DC | PRN
  Administered 2023-01-02: 25 ug/kg/min via INTRAVENOUS

## 2023-01-02 MED ORDER — ONDANSETRON HCL 4 MG/2ML IJ SOLN
INTRAMUSCULAR | Status: DC | PRN
  Administered 2023-01-02: 4 mg via INTRAVENOUS

## 2023-01-02 MED ORDER — MIDAZOLAM HCL 2 MG/2ML IJ SOLN
INTRAMUSCULAR | Status: AC
  Filled 2023-01-02: qty 2

## 2023-01-02 MED ORDER — EPHEDRINE SULFATE-NACL 50-0.9 MG/10ML-% IV SOSY
PREFILLED_SYRINGE | INTRAVENOUS | Status: DC | PRN
  Administered 2023-01-02 (×3): 5 mg via INTRAVENOUS
  Administered 2023-01-02: 10 mg via INTRAVENOUS

## 2023-01-02 SURGICAL SUPPLY — 98 items
APL PRP STRL LF DISP 70% ISPRP (MISCELLANEOUS) ×2
BANDAGE ESMARK 6X9 LF (GAUZE/BANDAGES/DRESSINGS) IMPLANT
BIT DRILL 2.4 AO COUPLING CANN (BIT) IMPLANT
BIT DRILL CAL 2.5 ST W/SLV (BIT) IMPLANT
BIT TREPHINE CORING 8 (BIT) IMPLANT
BLADE AVERAGE 25X9 (BLADE) IMPLANT
BLADE MICRO SAGITTAL (BLADE) IMPLANT
BLADE SURG 15 STRL LF DISP TIS (BLADE) ×4 IMPLANT
BLADE SURG 15 STRL SS (BLADE) ×4
BNDG CMPR 9X6 STRL LF SNTH (GAUZE/BANDAGES/DRESSINGS)
BNDG ELASTIC 4X5.8 VLCR STR LF (GAUZE/BANDAGES/DRESSINGS) ×2 IMPLANT
BNDG ELASTIC 6X5.8 VLCR STR LF (GAUZE/BANDAGES/DRESSINGS) ×2 IMPLANT
BNDG ESMARK 6X9 LF (GAUZE/BANDAGES/DRESSINGS)
BOOT STEPPER DURA LG (SOFTGOODS) IMPLANT
BOOT STEPPER DURA MED (SOFTGOODS) IMPLANT
BOOT STEPPER DURA XLG (SOFTGOODS) IMPLANT
CHLORAPREP W/TINT 26 (MISCELLANEOUS) ×2 IMPLANT
COVER BACK TABLE 60X90IN (DRAPES) ×2 IMPLANT
CUFF TOURN SGL QUICK 34 (TOURNIQUET CUFF)
CUFF TRNQT CYL 34X4.125X (TOURNIQUET CUFF) IMPLANT
DRAPE EXTREMITY T 121X128X90 (DISPOSABLE) ×2 IMPLANT
DRAPE OEC MINIVIEW 54X84 (DRAPES) ×2 IMPLANT
DRAPE U-SHAPE 47X51 STRL (DRAPES) ×2 IMPLANT
DRSG MEPITEL 4X7.2 (GAUZE/BANDAGES/DRESSINGS) ×2 IMPLANT
ELECT REM PT RETURN 9FT ADLT (ELECTROSURGICAL) ×2
ELECTRODE REM PT RTRN 9FT ADLT (ELECTROSURGICAL) ×2 IMPLANT
GAUZE PAD ABD 8X10 STRL (GAUZE/BANDAGES/DRESSINGS) ×4 IMPLANT
GAUZE SPONGE 4X4 12PLY STRL (GAUZE/BANDAGES/DRESSINGS) ×2 IMPLANT
GLOVE BIO SURGEON STRL SZ8 (GLOVE) ×2 IMPLANT
GLOVE BIOGEL PI IND STRL 7.0 (GLOVE) IMPLANT
GLOVE BIOGEL PI IND STRL 7.5 (GLOVE) IMPLANT
GLOVE BIOGEL PI IND STRL 8 (GLOVE) ×4 IMPLANT
GLOVE ECLIPSE 8.0 STRL XLNG CF (GLOVE) ×2 IMPLANT
GLOVE SURG SS PI 7.0 STRL IVOR (GLOVE) IMPLANT
GLOVE SURG SYN 7.5  E (GLOVE) ×2
GLOVE SURG SYN 7.5 E (GLOVE) ×2 IMPLANT
GLOVE SURG SYN 7.5 PF PI (GLOVE) IMPLANT
GOWN STRL REUS W/ TWL LRG LVL3 (GOWN DISPOSABLE) ×2 IMPLANT
GOWN STRL REUS W/ TWL XL LVL3 (GOWN DISPOSABLE) ×4 IMPLANT
GOWN STRL REUS W/TWL LRG LVL3 (GOWN DISPOSABLE) ×4
GOWN STRL REUS W/TWL XL LVL3 (GOWN DISPOSABLE) ×6
K-WIRE TROC 1.25X150 (WIRE) ×8
KIT STRATUM INSTRUMENT STD (KITS) IMPLANT
KWIRE TROC 1.25X150 (WIRE) IMPLANT
LOOP VASCLR MAXI BLUE 18IN ST (MISCELLANEOUS) IMPLANT
LOOP VASCULAR MAXI 18 BLUE (MISCELLANEOUS)
LOOP VASCULAR MINI 18 RED (MISCELLANEOUS)
LOOPS VASCLR MAXI BLUE 18IN ST (MISCELLANEOUS) IMPLANT
NDL HYPO 22X1.5 SAFETY MO (MISCELLANEOUS) ×2 IMPLANT
NDL HYPO 25X1 1.5 SAFETY (NEEDLE) IMPLANT
NDL SAFETY ECLIP 18X1.5 (MISCELLANEOUS) IMPLANT
NEEDLE HYPO 22X1.5 SAFETY MO (MISCELLANEOUS) ×2 IMPLANT
NEEDLE HYPO 25X1 1.5 SAFETY (NEEDLE) IMPLANT
NEEDLE SAFETY HYPO 22GAX1.5 (MISCELLANEOUS) ×2
NS IRRIG 1000ML POUR BTL (IV SOLUTION) ×2 IMPLANT
PACK BASIN DAY SURGERY FS (CUSTOM PROCEDURE TRAY) ×2 IMPLANT
PAD CAST 4YDX4 CTTN HI CHSV (CAST SUPPLIES) ×2 IMPLANT
PADDING CAST COTTON 4X4 STRL (CAST SUPPLIES) ×2
PADDING CAST COTTON 6X4 STRL (CAST SUPPLIES) ×2 IMPLANT
PENCIL SMOKE EVACUATOR (MISCELLANEOUS) ×2 IMPLANT
PLATE SNGL LISFRANC HOLE 3 (Plate) IMPLANT
SANITIZER HAND PURELL FF 515ML (MISCELLANEOUS) ×2 IMPLANT
SCREW  STRATUM NL LP ST 3.5X22 (Screw) ×2 IMPLANT
SCREW CANN FT 4.0X46MM (Screw) IMPLANT
SCREW CANN PT 4X42 NS (Screw) IMPLANT
SCREW CANN PT 4X44 NS (Screw) IMPLANT
SCREW CANNULATED PT 4.0X42 (Screw) ×2 IMPLANT
SCREW CANNULATED PT 4.0X44 (Screw) ×2 IMPLANT
SCREW LOCK ST STRM 3.5X26 (Screw) IMPLANT
SCREW LOCK STRATUM 3.5X18 (Screw) IMPLANT
SCREW LOCKING STRM 3.5X28 ST (Screw) IMPLANT
SCREW MDS 3.5X14 STRL (Screw) IMPLANT
SCREW NL LP STRATUM 3.5X16 (Screw) IMPLANT
SCREW STRATUM NL LP ST 3.5X22 (Screw) IMPLANT
SCREW STRM NL LP 3.5X20 (Screw) IMPLANT
SHEET MEDIUM DRAPE 40X70 STRL (DRAPES) ×2 IMPLANT
SLEEVE SCD COMPRESS KNEE MED (STOCKING) ×2 IMPLANT
SPLINT PLASTER CAST FAST 5X30 (CAST SUPPLIES) ×40 IMPLANT
SPONGE SURGIFOAM ABS GEL 12-7 (HEMOSTASIS) IMPLANT
SPONGE T-LAP 18X18 ~~LOC~~+RFID (SPONGE) ×2 IMPLANT
STOCKINETTE 6  STRL (DRAPES) ×2
STOCKINETTE 6 STRL (DRAPES) ×2 IMPLANT
SUCTION FRAZIER HANDLE 10FR (MISCELLANEOUS) ×2
SUCTION TUBE FRAZIER 10FR DISP (MISCELLANEOUS) ×2 IMPLANT
SUT ETHILON 3 0 PS 1 (SUTURE) ×2 IMPLANT
SUT MNCRL AB 3-0 PS2 18 (SUTURE) ×2 IMPLANT
SUT VIC AB 2-0 SH 27 (SUTURE) ×2
SUT VIC AB 2-0 SH 27XBRD (SUTURE) ×2 IMPLANT
SUT VICRYL 0 SH 27 (SUTURE) IMPLANT
SYR BULB EAR ULCER 3OZ GRN STR (SYRINGE) ×2 IMPLANT
SYR CONTROL 10ML LL (SYRINGE) IMPLANT
TOWEL GREEN STERILE FF (TOWEL DISPOSABLE) ×4 IMPLANT
TUBE CONNECTING 20X1/4 (TUBING) ×2 IMPLANT
UNDERPAD 30X36 HEAVY ABSORB (UNDERPADS AND DIAPERS) ×2 IMPLANT
VASCULAR TIE MAXI BLUE 18IN ST (MISCELLANEOUS)
VASCULAR TIE MINI RED 18IN STL (MISCELLANEOUS) IMPLANT
WASHER FLAT 4.0 (Washer) IMPLANT
YANKAUER SUCT BULB TIP NO VENT (SUCTIONS) IMPLANT

## 2023-01-02 NOTE — Op Note (Signed)
01/02/2023  2:43 PM  PATIENT:  Stuart Gaines  67 y.o. male  PRE-OPERATIVE DIAGNOSIS: 1.  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  short right Achilles tendon  POST-OPERATIVE DIAGNOSIS:  same  Procedure(s): Right gastrocnemius recession Right 1st, 2nd and 3rd TMT joint arthrodeses AP, lateral and oblique xrays of the right foot  SURGEON:  Wylene Simmer, MD  ASSISTANT: none  ANESTHESIA:   General, regional  EBL:  minimal   TOURNIQUET:   Total Tourniquet Time Documented: Thigh (Right) - 116 minutes Total: Thigh (Right) - [id number] minutes  COMPLICATIONS:  None apparent  DISPOSITION:  Extubated, awake and stable to recovery.  INDICATION FOR PROCEDURE: 67 year old male without significant past medical history complains of right foot pain worsening over the last couple of years.  He has severe midfoot arthritis particularly at the first, second and third TMT joints.  He also has a tight heel cord.  He has failed nonoperative treatment including activity modification, oral anti-inflammatories and shoewear modification.  He presents now for operative treatment of this painful and limiting condition.  The risks and benefits of the alternative treatment options have been discussed in detail.  The patient wishes to proceed with surgery and specifically understands risks of bleeding, infection, nerve damage, blood clots, need for additional surgery, amputation and death.   PROCEDURE IN DETAIL:  After pre operative consent was obtained, and the correct operative site was identified, the patient was brought to the operating room and placed supine on the OR table.  Anesthesia was administered.  Pre-operative antibiotics were administered.  A surgical timeout was taken.  The right lower extremity was prepped and draped in standard sterile fashion with a tourniquet around the thigh.  The extremity was elevated, and the tourniquet was inflated to 250 mmHg.  An incision was then made over the  medial calf.  Dissection was carried sharply down through the subcutaneous tissues.  The superficial fascia was incised.  The gastrocnemius tendon was identified.  The plantaris tendon was divided under direct vision.  Care was taken to protect the sural nerve.  The gastrocnemius tendon was then divided from medial to lateral under direct vision.  The ankle was then dorsiflexed approximately 30 degrees with the knee extended.  The wound was irrigated and sprinkled with vancomycin powder.  Subcutaneous tissues were approximated with Monocryl.  Skin incision was closed with nylon.  Attention was turned to the dorsum of the foot.  An incision was then made over the first and second TMT joints.  Dissection was carried sharply down through the subcutaneous tissues.  The interval between the EHL and EHB tendons was developed.  The neurovascular bundle was identified.  It was mobilized and retracted.  It was protected throughout the case.  The periosteum and joint capsule over the first TMT joint were incised and elevated.  The Lisfranc interval was cleared of all soft tissue and mobilized as well.  The second TMT joint was incised and the capsule was elevated.  Curette and rondure were used to remove the remaining articular cartilage and subchondral bone at the second TMT joint.  An oscillating saw was used to remove the articular cartilage and subchondral bone from both sides of the first TMT joint.  An incision was then made over the third TMT joint.  Dissection was carried down through the subcutaneous tissues.  The third TMT joint capsule was incised and elevated.  The dorsal osteophytes as well as articular cartilage and subchondral bone were removed with rongeurs.  All 3 joints were then irrigated copiously.  The joint surfaces were perforated with a small drill bit leaving the resultant bone graft in place.  The first TMT joint was then reduced and provisionally pinned.  Radiographs confirmed appropriate  reduction of the joint.  The second TMT was also reduced and pinned.  The Lisfranc interval was closed down with a Weber tenaculum.  A guidepin was inserted from the medial cuneiform to the second metatarsal base in the homerun position.  The first TMT joint was then compressed with a 4 mm partially-threaded cannulated screw from the [employer] 4/5 cannulated screw set.  The screw was inserted with a washer.  It was noted to compress the joint appropriately.  The homerun screw was then inserted after predrilling.  It was noted to compress the Lisfranc interval appropriately.  A 3-hole stratum Lisfranc plate template was then laid over the second TMT joint.  The proximal holes were drilled and the template removed.  The 3-hole stratum plate was then inserted in the proximal holes and tamped into place.  It was further secured proximally with a bicortical locking screw.  The compression slot was then drilled distally.  The compression knot was inserted and compressed the second TMT joint appropriately.  The distal limb of the plate was then used to insert a locking screw and a nonlocking screw.  The compression knot was removed and replaced with a bicortical nonlocking screw.  Attention was then turned to the third TMT joint.  Again the template was placed over the third TMT joint.  The plate was then inserted after drilling the tine holes.  The plate was secured proximally with a locking screw.  Distally the compression knot was inserted and used to compress the joint appropriately.  A locking bicortical screw was then inserted.  The most distal hole could not be used to insert another screw.  The compression slot pin was removed and a bicortical screw inserted through the compression slot.  Final AP, oblique and lateral radiographs of the right foot were obtained.  These show interval arthrodesis of the first, second and third TMT joints.  Hardware is appropriately positioned and of the appropriate lengths.   No other acute injuries are noted.  The wounds were irrigated copiously and sprinkled with vancomycin powder.  Subcutaneous tissues were approximated with Monocryl.  Skin incisions were closed with nylon.  Sterile dressings were applied followed by a well-padded short leg splint.  The tourniquet was released after application of the dressings.  The patient was awakened from anesthesia and transported to the recovery room in stable condition.  FOLLOW UP PLAN: Nonweightbearing on the right lower extremity.  Xarelto for DVT prophylaxis for 2 weeks followed by aspirin.  Follow-up with me in the office in 2 weeks for suture removal and conversion to a short leg cast.  Plan 6 weeks postoperative nonweightbearing immobilization.   RADIOGRAPHS: AP, oblique and lateral radiographs of the right foot are obtained intraoperatively.  These show interval arthrodesis of the first, second and third TMT joints.  Hardware is appropriately positioned and of the appropriate lengths.  No other acute injuries are noted.

## 2023-01-02 NOTE — H&P (Signed)
Stuart Gaines is an 67 y.o. male.   Chief Complaint: right foot pain HPI: 67 y/o male without significant PMH c/o worsening right midfoot pain due to 1-3 TMT joint arthritis.  He has failed non op treatment including activity modification, shoe wear modification and oral NSAIDs.  He presents today for right midfoot arthrodeses.  Past Medical History:  Diagnosis Date   Chronic back pain    High cholesterol    Hypertension     Past Surgical History:  Procedure Laterality Date   BACK SURGERY     CHOLECYSTECTOMY      Family History  Problem Relation Age of Onset   Anemia Mother    Stroke Father    Anemia Father    Throat cancer Father    Diabetes Brother    Ovarian cancer Paternal Aunt    Prostate cancer Paternal Uncle    Pancreatic cancer Paternal Uncle    Cancer Maternal Grandfather    Stroke Paternal Grandmother    Social History:  reports that he quit smoking about 49 years ago. His smoking use included cigarettes. He has a 0.50 pack-year smoking history. He does not have any smokeless tobacco history on file. He reports that he does not drink alcohol and does not use drugs.  Allergies:  Allergies  Allergen Reactions   Duloxetine Hcl     Other reaction(s): hallucinations   Lisinopril     Other reaction(s): blurred vision and feeling light-headed   Oxycodone Other (See Comments)   Oxycodone Hcl     Other reaction(s): anger/tachycardia   Pregabalin     Other reaction(s): hallucinations   Tramadol Hcl     Other reaction(s): did not help   Ampicillin Anxiety    Medications Prior to Admission  Medication Sig Dispense Refill   amLODipine (NORVASC) 5 MG tablet Take 5 mg by mouth every morning.     atorvastatin (LIPITOR) 40 MG tablet Take 40 mg by mouth at bedtime.     HYDROcodone-acetaminophen (NORCO) 7.5-325 MG per tablet Take 1 tablet by mouth 2 (two) times daily as needed (for pain).      lisinopril (ZESTRIL) 5 MG tablet Take 5 mg by mouth every morning.      Multiple Vitamins-Minerals (MULTIVITAMIN GUMMIES ADULT) CHEW See admin instructions.     omeprazole (PRILOSEC) 20 MG capsule Take 20 mg by mouth daily.     VENTOLIN HFA 108 (90 BASE) MCG/ACT inhaler Inhale 1-2 puffs into the lungs as needed.       No results found for this or any previous visit (from the past 48 hour(s)). No results found.  Review of Systems  no recent f/c/n/v/wt loss  Blood pressure 130/84, pulse 73, temperature (!) 97.2 F (36.2 C), temperature source Oral, resp. rate 14, height 5' 10"$  (1.778 m), weight 108.8 kg, SpO2 97 %. Physical Exam  35 wd man in nad.  A and O x 4.  Normal mood and affect. EOMI.  Resp unlabored.  R foot with dorsal swelling.  TTP at the TMT joints.  Normal skin.  Palpable pulses.  Intact sens to LT dorsally and plantarly.  No lymphadenopathy.  Assessment/Plan R midfoot arthritis - to the OR today for 1-3 TMT joint arthrodeses.  The risks and benefits of the alternative treatment options have been discussed in detail.  The patient wishes to proceed with surgery and specifically understands risks of bleeding, infection, nerve damage, blood clots, need for additional surgery, amputation and death.   Wylene Simmer, MD 01-29-2023,  11:53 AM

## 2023-01-02 NOTE — Anesthesia Postprocedure Evaluation (Signed)
Anesthesia Post Note  Patient: Stuart Gaines  Procedure(s) Performed: Right First, Second and Third Tarsal Metatarsal Joint Arthrodesis (Right: Foot) Gastrocnemius Recession (Right: Leg Lower)     Patient location during evaluation: PACU Anesthesia Type: Regional and General Level of consciousness: awake Pain management: pain level controlled Vital Signs Assessment: post-procedure vital signs reviewed and stable Respiratory status: spontaneous breathing, nonlabored ventilation and respiratory function stable Cardiovascular status: blood pressure returned to baseline and stable Postop Assessment: no apparent nausea or vomiting Anesthetic complications: no   No notable events documented.  Last Vitals:  Vitals:   01/02/23 1500 01/02/23 1526  BP: 135/61 136/73  Pulse: 98 (!) 101  Resp: 11 16  Temp:  36.9 C  SpO2: 94% 94%    Last Pain:  Vitals:   01/02/23 1526  TempSrc: Oral  PainSc:                  Davari Lopes P Lavoris Sparling

## 2023-01-02 NOTE — Anesthesia Procedure Notes (Signed)
Procedure Name: LMA Insertion Date/Time: 01/02/2023 12:14 PM  Performed by: Renia Mikelson, Ernesta Amble, CRNAPre-anesthesia Checklist: Patient identified, Emergency Drugs available, Suction available and Patient being monitored Patient Re-evaluated:Patient Re-evaluated prior to induction Oxygen Delivery Method: Circle system utilized Preoxygenation: Pre-oxygenation with 100% oxygen Induction Type: IV induction Ventilation: Mask ventilation without difficulty LMA: LMA inserted LMA Size: 4.0 Number of attempts: 1 Airway Equipment and Method: Bite block Placement Confirmation: positive ETCO2 Tube secured with: Tape Dental Injury: Teeth and Oropharynx as per pre-operative assessment

## 2023-01-02 NOTE — Transfer of Care (Signed)
Immediate Anesthesia Transfer of Care Note  Patient: Stuart Gaines  Procedure(s) Performed: Right First, Second and Third Tarsal Metatarsal Joint Arthrodesis (Right: Foot) Gastrocnemius Recession (Right: Leg Lower)  Patient Location: PACU  Anesthesia Type:GA combined with regional for post-op pain  Level of Consciousness: drowsy and patient cooperative  Airway & Oxygen Therapy: Patient Spontanous Breathing and Patient connected to face mask oxygen  Post-op Assessment: Report given to RN and Post -op Vital signs reviewed and stable  Post vital signs: Reviewed and stable  Last Vitals:  Vitals Value Taken Time  BP 147/72   Temp    Pulse 79 01/02/23 1442  Resp 13 01/02/23 1442  SpO2 99 % 01/02/23 1442  Vitals shown include unvalidated device data.  Last Pain:  Vitals:   01/02/23 1019  TempSrc: Oral  PainSc: 2          Complications: No notable events documented.

## 2023-01-02 NOTE — Anesthesia Preprocedure Evaluation (Addendum)
Anesthesia Evaluation  Patient identified by MRN, date of birth, ID band Patient awake    Reviewed: Allergy & Precautions, NPO status , Patient's Chart, lab work & pertinent test results  Airway Mallampati: II  TM Distance: >3 FB Neck ROM: Full    Dental no notable dental hx.    Pulmonary former smoker   Pulmonary exam normal        Cardiovascular hypertension, Pt. on medications Normal cardiovascular exam     Neuro/Psych  Headaches  negative psych ROS   GI/Hepatic Neg liver ROS,GERD  Medicated and Controlled,,  Endo/Other  negative endocrine ROS    Renal/GU negative Renal ROS     Musculoskeletal  (+) Arthritis ,    Abdominal  (+) + obese  Peds  Hematology negative hematology ROS (+)   Anesthesia Other Findings Acquired short right Achilles tendon  Reproductive/Obstetrics                             Anesthesia Physical Anesthesia Plan  ASA: 2  Anesthesia Plan: General and Regional   Post-op Pain Management: Regional block*   Induction: Intravenous  PONV Risk Score and Plan: 2 and Ondansetron, Dexamethasone, Midazolam and Treatment may vary due to age or medical condition  Airway Management Planned: LMA  Additional Equipment:   Intra-op Plan:   Post-operative Plan: Extubation in OR  Informed Consent: I have reviewed the patients History and Physical, chart, labs and discussed the procedure including the risks, benefits and alternatives for the proposed anesthesia with the patient or authorized representative who has indicated his/her understanding and acceptance.     Dental advisory given  Plan Discussed with: CRNA  Anesthesia Plan Comments:        Anesthesia Quick Evaluation

## 2023-01-02 NOTE — Discharge Instructions (Addendum)
Stuart Simmer, MD [facility]  Please read the following information regarding your care after surgery.  Medications  You only need a prescription for the narcotic pain medicine (ex. oxycodone, Percocet, Norco).  All of the other medicines listed below are available over the counter. X Aleve 2 pills twice a day for the first 3 days after surgery. X acetominophen (Tylenol) 650 mg every 4-6 hours as you need for minor to moderate pain X dilaudid as prescribed for severe pain  Narcotic pain medicine (ex. oxycodone, Percocet, Vicodin) will cause constipation.  To prevent this problem, take the following medicines while you are taking any pain medicine. X docusate sodium (Colace) 100 mg twice a day X senna (Senokot) 2 tablets twice a day  X To help prevent blood clots, take Xarelto once a day for two weeks after surgery.  Then take a baby aspirin (81 mg) twice a day for the following month.  You should also get up every hour while you are awake to move around.    Weight Bearing X Do not bear any weight on the operated leg or foot.  Cast / Splint / Dressing X Keep your splint, cast or dressing clean and dry.  Don't put anything (coat hanger, pencil, etc) down inside of it.  If it gets damp, use a hair dryer on the cool setting to dry it.  If it gets soaked, call the office to schedule an appointment for a cast change.  After your dressing, cast or splint is removed; you may shower, but do not soak or scrub the wound.  Allow the water to run over it, and then gently pat it dry.  Swelling It is normal for you to have swelling where you had surgery.  To reduce swelling and pain, keep your toes above your nose for at least 3 days after surgery.  It may be necessary to keep your foot or leg elevated for several weeks.  If it hurts, it should be elevated.  Follow Up Call my office at [phone] when you are discharged from the hospital or surgery center to schedule an appointment to be seen two weeks  after surgery.  Call my office at [phone] if you develop a fever >101.5 F, nausea, vomiting, bleeding from the surgical site or severe pain.     Post Anesthesia Home Care Instructions  Activity: Get plenty of rest for the remainder of the day. A responsible individual must stay with you for 24 hours following the procedure.  For the next 24 hours, DO NOT: -Drive a car -Paediatric nurse -Drink alcoholic beverages -Take any medication unless instructed by your physician -Make any legal decisions or sign important papers.  Meals: Start with liquid foods such as gelatin or soup. Progress to regular foods as tolerated. Avoid greasy, spicy, heavy foods. If nausea and/or vomiting occur, drink only clear liquids until the nausea and/or vomiting subsides. Call your physician if vomiting continues.  Special Instructions/Symptoms: Your throat may feel dry or sore from the anesthesia or the breathing tube placed in your throat during surgery. If this causes discomfort, gargle with warm salt water. The discomfort should disappear within 24 hours.     Regional Anesthesia Blocks  1. Numbness or the inability to move the "blocked" extremity may last from 3-48 hours after placement. The length of time depends on the medication injected and your individual response to the medication. If the numbness is not going away after 48 hours, call your surgeon.  2. The extremity that  is blocked will need to be protected until the numbness is gone and the  Strength has returned. Because you cannot feel it, you will need to take extra care to avoid injury. Because it may be weak, you may have difficulty moving it or using it. You may not know what position it is in without looking at it while the block is in effect.  3. For blocks in the legs and feet, returning to weight bearing and walking needs to be done carefully. You will need to wait until the numbness is entirely gone and the strength has returned. You  should be able to move your leg and foot normally before you try and bear weight or walk. You will need someone to be with you when you first try to ensure you do not fall and possibly risk injury.  4. Bruising and tenderness at the needle site are common side effects and will resolve in a few days.  5. Persistent numbness or new problems with movement should be communicated to the surgeon or the [facility] [phone] [place] [phone]).  Tylenol can be taken today 430pm if needed.

## 2023-01-02 NOTE — Anesthesia Procedure Notes (Signed)
Anesthesia Regional Block: Adductor canal block   Pre-Anesthetic Checklist: , timeout performed,  Correct Patient, Correct Site, Correct Laterality,  Correct Procedure,, site marked,  Risks and benefits discussed,  Surgical consent,  Pre-op evaluation,  At surgeon's request and post-op pain management  Laterality: Right  Prep: chloraprep       Needles:  Injection technique: Single-shot  Needle Type: Echogenic Stimulator Needle     Needle Length: 10cm  Needle Gauge: 20     Additional Needles:   Procedures:,,,, ultrasound used (permanent image in chart),,    Narrative:  Start time: 01/02/2023 11:20 AM End time: 01/02/2023 11:30 AM Injection made incrementally with aspirations every 5 mL.  Performed by: Personally  Anesthesiologist: Murvin Natal, MD  Additional Notes: Functioning IV was confirmed and monitors were applied. A time-out was performed. Hand hygiene and sterile gloves were used. The thigh was placed in a frog-leg position and prepped in a sterile fashion. A 134m 20ga Bbraun echogenic stimulator needle was placed using ultrasound guidance.  Negative aspiration and negative test dose prior to incremental administration of local anesthetic. The patient tolerated the procedure well.

## 2023-01-02 NOTE — Anesthesia Procedure Notes (Signed)
Anesthesia Regional Block: Popliteal block   Pre-Anesthetic Checklist: , timeout performed,  Correct Patient, Correct Site, Correct Laterality,  Correct Procedure, Correct Position, site marked,  Risks and benefits discussed,  Surgical consent,  Pre-op evaluation,  At surgeon's request and post-op pain management  Laterality: Right  Prep: chloraprep       Needles:  Injection technique: Single-shot  Needle Type: Echogenic Stimulator Needle     Needle Length: 10cm  Needle Gauge: 20     Additional Needles:   Procedures:,,,, ultrasound used (permanent image in chart),,    Narrative:  Start time: 01/02/2023 11:10 AM End time: 01/02/2023 11:20 AM Injection made incrementally with aspirations every 5 mL.  Performed by: Personally  Anesthesiologist: Murvin Natal, MD  Additional Notes: Functioning IV was confirmed and monitors were applied.  A timeout was performed. Sterile prep, hand hygiene and sterile gloves were used. A 192m 20ga Braun echogenic stimulator needle was used. Negative aspiration and negative test dose prior to incremental administration of local anesthetic. The patient tolerated the procedure well.  Ultrasound guidance: relevent anatomy identified, needle position confirmed, local anesthetic spread visualized around nerve(s), vascular puncture avoided.  Image printed for medical record.

## 2023-01-02 NOTE — Progress Notes (Signed)
Assisted Dr. Roanna Banning with right, popliteal/saphenous, ultrasound guided block. Side rails up, monitors on throughout procedure. See vital signs in flow sheet. Tolerated Procedure well.

## 2023-01-03 ENCOUNTER — Encounter (HOSPITAL_BASED_OUTPATIENT_CLINIC_OR_DEPARTMENT_OTHER): Payer: Self-pay | Admitting: Orthopedic Surgery

## 2023-01-03 NOTE — Progress Notes (Signed)
RN left voicemail for call back regarding PSMA PET.

## 2023-01-06 ENCOUNTER — Ambulatory Visit: Payer: Self-pay | Admitting: Genetic Counselor

## 2023-01-06 ENCOUNTER — Other Ambulatory Visit: Payer: Self-pay | Admitting: Urology

## 2023-01-06 ENCOUNTER — Encounter: Payer: Self-pay | Admitting: Genetic Counselor

## 2023-01-06 DIAGNOSIS — Z1379 Encounter for other screening for genetic and chromosomal anomalies: Secondary | ICD-10-CM

## 2023-01-06 NOTE — Progress Notes (Signed)
HPI:   Stuart Gaines was previously seen in the [place] clinic due to a personal and family history of cancer and concerns regarding a hereditary predisposition to cancer. Please refer to our prior cancer genetics clinic note for more information regarding our discussion, assessment and recommendations, at the time. Stuart Gaines recent genetic test results were disclosed to him, as were recommendations warranted by these results. These results and recommendations are discussed in more detail below.  CANCER HISTORY:  Oncology History  Malignant neoplasm of prostate ([place])  11/27/2022 Cancer Staging   Staging form: Prostate, AJCC 8th Edition - Clinical stage from 11/27/2022: Stage IIC (cT1c, cN0, cM0, PSA: 8, Grade Group: 3) - Signed by Freeman Caldron, PA-C on 12/13/2022 Histopathologic type: Adenocarcinoma, NOS Stage prefix: Initial diagnosis Prostate specific antigen (PSA) range: Less than 10 Gleason primary pattern: 4 Gleason secondary pattern: 3 Gleason score: 7 Histologic grading system: 5 grade system Number of biopsy cores examined: 15 Number of biopsy cores positive: 3 Location of positive needle core biopsies: One side   12/13/2022 Initial Diagnosis   Malignant neoplasm of prostate ([place])     FAMILY HISTORY:  We obtained a detailed, 4-generation family history.  Significant diagnoses are listed below:      Family History  Problem Relation Age of Onset   Anemia Mother     Stroke Father     Anemia Father     Throat cancer Father     Diabetes Brother     Ovarian cancer Paternal Aunt     Prostate cancer Paternal Uncle     Pancreatic cancer Paternal Uncle     Cancer Maternal Grandfather     Stroke Paternal Grandmother         Stuart Gaines father was diagnosed with throat cancer at an unknown age, he smoked and died at age 51. He had three paternal aunts and one was diagnosed with ovarian cancer, she is deceased. He has four paternal uncles. One uncle was  diagnosed with prostate cancer and one was diagnosed with pancreatic cancer, they are deceased. His paternal grandfather was diagnosed with an unknown type of cancer, he is deceased. Stuart Gaines is unaware of previous family history of genetic testing for hereditary cancer risks. There is no reported Ashkenazi Jewish ancestry.   GENETIC TEST RESULTS:  The [facility] Common Cancer Panel found no pathogenic mutations. {  The Common Hereditary Cancers Panel offered by [facility] includes sequencing and/or deletion duplication testing of the following 48 genes: APC, ATM, AXIN2, BAP1, BARD1, BMPR1A, BRCA1, BRCA2, BRIP1, CDH1, CDK4, CDKN2A (p14ARF and p16INK4a only), CHEK2, CTNNA1, DICER1, EPCAM (Deletion/duplication testing only), FH, GREM1 (promoter region duplication testing only), HOXB13, KIT, MBD4, MEN1, MLH1, MSH2, MSH3, MSH6, MUTYH, NF1, NHTL1, PALB2, PDGFRA, PMS2, POLD1, POLE, PTEN, RAD51C, RAD51D, SDHA (sequencing analysis only except exon 14), SDHB, SDHC, SDHD, SMAD4, SMARCA4. STK11, TP53, TSC1, TSC2, and VHL.   The test report has been scanned into EPIC and is located under the Molecular Pathology section of the Results Review tab.  A portion of the result report is included below for reference. Genetic testing reported out on 12/29/2022.       Even though a pathogenic variant was not identified, possible explanations for the cancer in the family may include: There may be no hereditary risk for cancer in the family. The cancers in Stuart Gaines and/or his family may be due to other genetic or environmental factors. There may be a gene mutation in one of these genes  that current testing methods cannot detect, but that chance is small. There could be another gene that has not yet been discovered, or that we have not yet tested, that is responsible for the cancer diagnoses in the family.  It is also possible there is a hereditary cause for the cancer in the family that Stuart Gaines did not  inherit.  Therefore, it is important to remain in touch with cancer genetics in the future so that we can continue to offer Stuart Gaines the most up to date genetic testing.   ADDITIONAL GENETIC TESTING:  We discussed with Stuart Gaines that his genetic testing was fairly extensive.  If there are genes identified to increase cancer risk that can be analyzed in the future, we would be happy to discuss and coordinate this testing at that time.    CANCER SCREENING RECOMMENDATIONS:  Stuart Gaines test result is considered negative (normal).  This means that we have not identified a hereditary cause for his personal and family history of cancer at this time.   An individual's cancer risk and medical management are not determined by genetic test results alone. Overall cancer risk assessment incorporates additional factors, including personal medical history, family history, and any available genetic information that may result in a personalized plan for cancer prevention and surveillance. Therefore, it is recommended he continue to follow the cancer management and screening guidelines provided by his oncology and primary healthcare provider.  RECOMMENDATIONS FOR FAMILY MEMBERS:   Since he did not inherit a mutation in a cancer predisposition gene included on this panel, his children could not have inherited a mutation from him in one of these genes.  FOLLOW-UP:  Cancer genetics is a rapidly advancing field and it is possible that new genetic tests will be appropriate for him and/or his family members in the future. We encouraged him to remain in contact with cancer genetics on an annual basis so we can update his personal and family histories and let him know of advances in cancer genetics that may benefit this family.   Our contact number was provided. Stuart Gaines questions were answered to his satisfaction, and he knows he is welcome to call us at anytime with additional questions or concerns.    Lucille Passy, MS, [facility] Genetic Counselor [email or web address] ([email or web address]) [phone]

## 2023-01-06 NOTE — Progress Notes (Signed)
RN spoke with patient and he is aware of upcoming PSMA PET scan on 2/28, followed by additional consult with Dr. Alinda Money on 3/5.    No additional needs at this time.   Plan of care in progress.

## 2023-01-15 ENCOUNTER — Encounter (HOSPITAL_COMMUNITY)
Admission: RE | Admit: 2023-01-15 | Discharge: 2023-01-15 | Disposition: A | Discharge status: Home / Self Care | Payer: Medicare Other | Source: Ambulatory Visit | Attending: Urology | Admitting: Urology

## 2023-01-15 DIAGNOSIS — C61 Malignant neoplasm of prostate: Secondary | ICD-10-CM

## 2023-01-15 DIAGNOSIS — M6701 Short Achilles tendon (acquired), right ankle: Secondary | ICD-10-CM | POA: Diagnosis not present

## 2023-01-15 MED ORDER — PIFLIFOLASTAT F 18 (PYLARIFY) INJECTION
9.0000 | Freq: Once | INTRAVENOUS | Status: AC
  Administered 2023-01-15: 9.3 via INTRAVENOUS

## 2023-01-19 NOTE — Progress Notes (Signed)
COVID Vaccine received:  '[]'$  No '[x]'$  Yes Date of any COVID positive Test in last 90 days:  PCP -Maury Dus, MD at [facility] - none Pain Medicine- Donald Prose MD at [facility]  Chest x-ray - 08-20-2018  epic EKG -  12-31-2022  epic Stress Test -  ECHO - 12-15-2019  epic Cardiac Cath -   PCR screen: '[]'$  Ordered & Completed                      '[]'$   No Order but Needs PROFEND                      '[x]'$   N/A for this surgery  Surgery Plan:  '[]'$  Ambulatory                            '[x]'$  Outpatient in bed                            '[]'$  Admit  Anesthesia:    '[x]'$  General  '[]'$  Spinal                           '[]'$   Choice '[]'$   MAC  Bowel Prep - '[]'$  No  '[]'$   Yes ______  Pacemaker / ICD device '[x]'$  No '[]'$  Yes        Device order form faxed '[x]'$  No    '[]'$   Yes      Faxed to:  Spinal Cord Stimulator:'[x]'$  No '[]'$  Yes      (Remind patient to bring remote DOS) Other Implants:   History of Sleep Apnea? '[x]'$  No '[]'$  Yes   CPAP used?- '[]'$  No '[]'$  Yes    Does the patient monitor blood sugar? '[]'$  No '[]'$  Yes  '[x]'$  N/A  Patient has: '[]'$  Pre-DM   '[]'$  DM1  '[]'$   DM2 Does patient have a [employer] or Dexacom? '[]'$  No '[]'$  Yes   Fasting Blood Sugar Ranges-  Checks Blood Sugar _____ times a day  Blood Thinner / Instructions: Xarelto- not taking now Aspirin Instructions: None  ERAS Protocol Ordered: '[x]'$  No  '[]'$  Yes Patient is to be NPO after: midnight prior  Comments:   Activity level: Patient is able / unable to climb a flight of stairs without difficulty; '[]'$  No CP  '[]'$  No SOB, but would have ___   Patient can / can not perform ADLs without assistance.   Anesthesia review: Liver disease (hemochromatosis), HTN, GERD, asthma, smokes.  Patient denies shortness of breath, fever, cough and chest pain at PAT appointment.  Patient verbalized understanding and agreement to the Pre-Surgical Instructions that were given to them at this PAT appointment. Patient was also educated of the need to review these PAT  instructions again prior to his/her surgery.I reviewed the appropriate phone numbers to call if they have any and questions or concerns.

## 2023-01-19 NOTE — Patient Instructions (Signed)
SURGICAL WAITING ROOM VISITATION Patients having surgery or a procedure may have no more than 2 support people in the waiting area - these visitors may rotate in the visitor waiting room.   Due to an increase in RSV and influenza rates and associated hospitalizations, children ages 50 and under may not visit patients in [place]. If the patient needs to stay at the hospital during part of their recovery, the visitor guidelines for inpatient rooms apply.  PRE-OP VISITATION  Pre-op nurse will coordinate an appropriate time for 1 support person to accompany the patient in pre-op.  This support person may not rotate.  This visitor will be contacted when the time is appropriate for the visitor to come back in the pre-op area.  Please refer to the [facility] website for the visitor guidelines for Inpatients (after your surgery is over and you are in a regular room).  You are not required to quarantine at this time prior to your surgery. However, you must do this: Hand Hygiene often Do NOT share personal items Notify your provider if you are in close contact with someone who has COVID or you develop fever 100.4 or greater, new onset of sneezing, cough, sore throat, shortness of breath or body aches.  If you test positive for Covid or have been in contact with anyone that has tested positive in the last 10 days please notify you surgeon.    Your procedure is scheduled on:  Monday  February 03, 2023  Report to [facility] Main Entrance: [place] entrance where the [employer] is available.   Report to admitting at:05:15  AM  +++++Call this number if you have any questions or problems the morning of surgery [phone]  DO NOT EAT OR DRINK ANYTHING AFTER MIDNIGHT THE NIGHT PRIOR TO YOUR SURGERY / PROCEDURE.   FOLLOW BOWEL PREP AND ANY ADDITIONAL PRE OP INSTRUCTIONS YOU RECEIVED FROM YOUR SURGEON'S OFFICE!!!  Magnesium Citrate- Drink 1 bottle (8 ounces)  at noon the day  before your surgery. Fleet Enema- use 1 enema the night before your surgery.   Oral Hygiene is also important to reduce your risk of infection.        Remember - BRUSH YOUR TEETH THE MORNING OF SURGERY WITH YOUR REGULAR TOOTHPASTE  Do NOT smoke after Midnight the night before surgery.  Take ONLY these medicines the morning of surgery with A SIP OF WATER: omeprazole, amlodipine  If You have been diagnosed with Sleep Apnea - Bring CPAP mask and tubing day of surgery. We will provide you with a CPAP machine on the day of your surgery.                   You may not have any metal on your body including  jewelry, and body piercing  Do not wear  lotions, powders, cologne, or deodorant  Men may shave face and neck.  Contacts, Hearing Aids, dentures or bridgework may not be worn into surgery. DENTURES WILL BE REMOVED PRIOR TO SURGERY PLEASE DO NOT APPLY "Poly grip" OR ADHESIVES!!!  You may bring a small overnight bag with you on the day of surgery, only pack items that are not valuable. [facility] IS NOT RESPONSIBLE   FOR VALUABLES THAT ARE LOST OR STOLEN.   Do not bring your home medications to the hospital. The Pharmacy will dispense medications listed on your medication list to you during your admission in the Hospital.  Special Instructions: Bring a copy of your  healthcare power of attorney and living will documents the day of surgery, if you wish to have them scanned into your [facility] Records- EPIC  Please read over the following fact sheets you were given: IF YOU HAVE QUESTIONS ABOUT YOUR [place], [place] [phone].   [place] - Preparing for Surgery Before surgery, you can play an important role.  Because skin is not sterile, your skin needs to be as free of germs as possible.  You can reduce the number of germs on your skin by washing with CHG (chlorahexidine gluconate) soap before surgery.  CHG is an antiseptic cleaner which kills germs and bonds with  the skin to continue killing germs even after washing. Please DO NOT use if you have an allergy to CHG or antibacterial soaps.  If your skin becomes reddened/irritated stop using the CHG and inform your nurse when you arrive at Short Stay. Do not shave (including legs and underarms) for at least 48 hours prior to the first CHG shower.  You may shave your face/neck.  Please follow these instructions carefully:  1.  Shower with CHG Soap the night before surgery and the  morning of surgery.  2.  If you choose to wash your hair, wash your hair first as usual with your normal  shampoo.  3.  After you shampoo, rinse your hair and body thoroughly to remove the shampoo.                             4.  Use CHG as you would any other liquid soap.  You can apply chg directly to the skin and wash.  Gently with a scrungie or clean washcloth.  5.  Apply the CHG Soap to your body ONLY FROM THE NECK DOWN.   Do not use on face/ open                           Wound or open sores. Avoid contact with eyes, ears mouth and genitals (private parts).                       Wash face,  Genitals (private parts) with your normal soap.             6.  Wash thoroughly, paying special attention to the area where your  surgery  will be performed.  7.  Thoroughly rinse your body with warm water from the neck down.  8.  DO NOT shower/wash with your normal soap after using and rinsing off the CHG Soap.            9.  Pat yourself dry with a clean towel.            10.  Wear clean pajamas.            11.  Place clean sheets on your bed the night of your first shower and do not  sleep with pets.  ON THE DAY OF SURGERY : Do not apply any lotions/deodorants the morning of surgery.  Please wear clean clothes to the hospital/surgery center.    FAILURE TO FOLLOW THESE INSTRUCTIONS MAY RESULT IN THE CANCELLATION OF YOUR SURGERY  PATIENT SIGNATURE_________________________________  NURSE  SIGNATURE__________________________________  ________________________________________________________________________

## 2023-01-21 ENCOUNTER — Encounter (HOSPITAL_COMMUNITY)
Admission: RE | Admit: 2023-01-21 | Discharge: 2023-01-21 | Disposition: A | Discharge status: Home / Self Care | Payer: Medicare Other | Source: Ambulatory Visit | Attending: Urology | Admitting: Urology

## 2023-01-21 ENCOUNTER — Encounter (HOSPITAL_COMMUNITY): Payer: Self-pay

## 2023-01-21 ENCOUNTER — Other Ambulatory Visit: Payer: Self-pay

## 2023-01-21 DIAGNOSIS — R8271 Bacteriuria: Secondary | ICD-10-CM | POA: Diagnosis not present

## 2023-01-21 DIAGNOSIS — Z01812 Encounter for preprocedural laboratory examination: Secondary | ICD-10-CM | POA: Diagnosis not present

## 2023-01-21 HISTORY — DX: Disease of blood and blood-forming organs, unspecified: D75.9

## 2023-01-21 HISTORY — DX: Other complications of anesthesia, initial encounter: T88.59XA

## 2023-01-21 HISTORY — DX: Unspecified osteoarthritis, unspecified site: M19.90

## 2023-01-21 HISTORY — DX: Unspecified asthma, uncomplicated: J45.909

## 2023-01-21 LAB — TYPE AND SCREEN
ABO/RH(D): O POS
Antibody Screen: NEGATIVE

## 2023-01-21 LAB — CBC
HCT: 48.3 % (ref 39.0–52.0)
Hemoglobin: 16.5 g/dL (ref 13.0–17.0)
MCH: 32.4 pg (ref 26.0–34.0)
MCHC: 34.2 g/dL (ref 30.0–36.0)
MCV: 94.9 fL (ref 80.0–100.0)
Platelets: 349 10*3/uL (ref 150–400)
RBC: 5.09 MIL/uL (ref 4.22–5.81)
RDW: 13.2 % (ref 11.5–15.5)
WBC: 8.8 10*3/uL (ref 4.0–10.5)
nRBC: 0 % (ref 0.0–0.2)

## 2023-01-21 LAB — COMPREHENSIVE METABOLIC PANEL
ALT: 33 U/L (ref 0–44)
AST: 28 U/L (ref 15–41)
Albumin: 4.4 g/dL (ref 3.5–5.0)
Alkaline Phosphatase: 151 U/L — ABNORMAL HIGH (ref 38–126)
Anion gap: 10 (ref 5–15)
BUN: 13 mg/dL (ref 8–23)
CO2: 25 mmol/L (ref 22–32)
Calcium: 9.7 mg/dL (ref 8.9–10.3)
Chloride: 105 mmol/L (ref 98–111)
Creatinine, Ser: 0.98 mg/dL (ref 0.61–1.24)
GFR, Estimated: >60 mL/min (ref >60)
Glucose, Bld: 109 mg/dL — ABNORMAL HIGH (ref 70–99)
Potassium: 4.2 mmol/L (ref 3.5–5.1)
Sodium: 140 mmol/L (ref 135–145)
Total Bilirubin: 1 mg/dL (ref 0.3–1.2)
Total Protein: 7.9 g/dL (ref 6.5–8.1)

## 2023-01-24 DIAGNOSIS — M6701 Short Achilles tendon (acquired), right ankle: Secondary | ICD-10-CM | POA: Diagnosis not present

## 2023-01-28 DIAGNOSIS — M62838 Other muscle spasm: Secondary | ICD-10-CM | POA: Diagnosis not present

## 2023-01-28 DIAGNOSIS — M6281 Muscle weakness (generalized): Secondary | ICD-10-CM | POA: Diagnosis not present

## 2023-02-10 DIAGNOSIS — M79671 Pain in right foot: Secondary | ICD-10-CM | POA: Diagnosis not present

## 2023-02-10 DIAGNOSIS — M79672 Pain in left foot: Secondary | ICD-10-CM | POA: Diagnosis not present

## 2023-02-17 NOTE — Progress Notes (Signed)
PCP - Gari Crown SUN AT [place] Cardiologist - NO Hematology -Narda Rutherford, MD  PPM/ICD -  Device Orders -  Rep Notified -   Chest x-ray - 2019 epic EKG - 12-31-22 epic Stress Test -  ECHO - 12-15-2019 Cardiac Cath -   Sleep Study -  CPAP -   Fasting Blood Sugar -  Checks Blood Sugar _____ times a day  Last dose of GLP1 agonist-   GLP1 instructions:   Blood Thinner Instructions: Aspirin Instructions:  ERAS Protcol - PRE-SURGERYN/A  COVID  vaccinated for covid  Activity- Able to climb a flight of stairs without SOB or CP Anesthesia review: hemachromatosis, HTN, GERD, Asthma   Patient denies shortness of breath, fever, cough and chest pain at PAT appointment   All instructions explained to the patient, with a verbal understanding of the material. Patient agrees to go over the instructions while at home for a better understanding. Patient also instructed to self quarantine after being tested for COVID-19. The opportunity to ask questions was provided.

## 2023-02-17 NOTE — Patient Instructions (Signed)
SURGICAL WAITING ROOM VISITATION  Patients having surgery or a procedure may have no more than 2 support people in the waiting area - these visitors may rotate.    Children under the age of 57 must have an adult with them who is not the patient.  Due to an increase in RSV and influenza rates and associated hospitalizations, children ages 73 and under may not visit patients in [place].  If the patient needs to stay at the hospital during part of their recovery, the visitor guidelines for inpatient rooms apply. Pre-op nurse will coordinate an appropriate time for 1 support person to accompany patient in pre-op.  This support person may not rotate.    Please refer to the [facility] website for the visitor guidelines for Inpatients (after your surgery is over and you are in a regular room).       Your procedure is scheduled on: 03-03-23   Report to [facility] Main Entrance    Report to admitting at         0900   AM   Call this number if you have problems the morning of surgery [phone]   Do not eat food  OR DRINK LIQUIDS :After Midnight.                           If you have questions, please contact your surgeon's office.   FOLLOW BOWEL PREP AND ANY ADDITIONAL PRE OP INSTRUCTIONS YOU RECEIVED FROM YOUR SURGEON'S OFFICE!!!   ONE BOTTLE 8OZ OF MAGNESIUM CITRATE BY NOON THE DAY BEFORE SURGERY ONE FLEETS ENEMA THE NIGHT BEFORE SURGERY   Oral Hygiene is also important to reduce your risk of infection.                                    Remember - BRUSH YOUR TEETH THE MORNING OF SURGERY WITH YOUR REGULAR TOOTHPASTE  DENTURES WILL BE REMOVED PRIOR TO SURGERY PLEASE DO NOT APPLY "Poly grip" OR ADHESIVES!!!   Do NOT smoke after Midnight   Take these medicines the morning of surgery with A SIP OF WATER: omeprazole, amlodipine, hydrocodone if needed    Bring CPAP mask and tubing day of surgery.                              You may not have any metal on  your body including hair pins, jewelry, and body piercing             Do not wear  lotions, powders, perfumes/cologne, or deodorant                Men may shave face and neck.   Do not bring valuables to the hospital. [place].   Contacts, glasses, dentures or bridgework may not be worn into surgery.   Bring small overnight bag day of surgery.   DO NOT [place]. PHARMACY WILL DISPENSE MEDICATIONS LISTED ON YOUR MEDICATION LIST TO YOU DURING YOUR ADMISSION [place]!    Patients discharged on the day of surgery will not be allowed to drive home.  Someone NEEDS to stay with you for the first 24 hours after anesthesia.  Special Instructions: Bring a copy of your healthcare power of attorney and living will documents the day of surgery if you haven't scanned them before.              Please read over the following fact sheets you were given: IF [place] [phone]   If you received a COVID test during your pre-op visit  it is requested that you wear a mask when out in public, stay away from anyone that may not be feeling well and notify your surgeon if you develop symptoms. If you test positive for Covid or have been in contact with anyone that has tested positive in the last 10 days please notify you surgeon.    [facility] - Preparing for Surgery Before surgery, you can play an important role.  Because skin is not sterile, your skin needs to be as free of germs as possible.  You can reduce the number of germs on your skin by washing with CHG (chlorahexidine gluconate) soap before surgery.  CHG is an antiseptic cleaner which kills germs and bonds with the skin to continue killing germs even after washing. Please DO NOT use if you have an allergy to CHG or antibacterial soaps.  If your skin becomes reddened/irritated stop using the CHG and inform  your nurse when you arrive at Short Stay. Do not shave (including legs and underarms) for at least 48 hours prior to the first CHG shower.  You may shave your face/neck. Please follow these instructions carefully:  1.  Shower with CHG Soap the night before surgery and the  morning of Surgery.  2.  If you choose to wash your hair, wash your hair first as usual with your  normal  shampoo.  3.  After you shampoo, rinse your hair and body thoroughly to remove the  shampoo.                           4.  Use CHG as you would any other liquid soap.  You can apply chg directly  to the skin and wash                       Gently with a scrungie or clean washcloth.  5.  Apply the CHG Soap to your body ONLY FROM THE NECK DOWN.   Do not use on face/ open                           Wound or open sores. Avoid contact with eyes, ears mouth and genitals (private parts).                       Wash face,  Genitals (private parts) with your normal soap.             6.  Wash thoroughly, paying special attention to the area where your surgery  will be performed.  7.  Thoroughly rinse your body with warm water from the neck down.  8.  DO NOT shower/wash with your normal soap after using and rinsing off  the CHG Soap.                9.  Pat yourself dry with a clean towel.            10.  Wear clean pajamas.  11.  Place clean sheets on your bed the night of your first shower and do not  sleep with pets. Day of Surgery : Do not apply any lotions/deodorants the morning of surgery.  Please wear clean clothes to the hospital/surgery center.  FAILURE TO FOLLOW THESE INSTRUCTIONS MAY RESULT IN THE CANCELLATION OF YOUR SURGERY PATIENT SIGNATURE_________________________________  NURSE SIGNATURE__________________________________  ________________________________________________________________________

## 2023-02-18 ENCOUNTER — Other Ambulatory Visit: Payer: Self-pay

## 2023-02-18 ENCOUNTER — Encounter (HOSPITAL_COMMUNITY): Payer: Self-pay

## 2023-02-18 ENCOUNTER — Encounter (HOSPITAL_COMMUNITY)
Admission: RE | Admit: 2023-02-18 | Discharge: 2023-02-18 | Disposition: A | Discharge status: Home / Self Care | Payer: Medicare Other | Source: Ambulatory Visit | Attending: Urology | Admitting: Urology

## 2023-02-18 VITALS — BP 133/76 | HR 94 | Temp 98.3°F | Resp 17 | Ht 70.0 in | Wt 236.0 lb

## 2023-02-18 DIAGNOSIS — Z01812 Encounter for preprocedural laboratory examination: Secondary | ICD-10-CM | POA: Diagnosis not present

## 2023-02-18 DIAGNOSIS — I10 Essential (primary) hypertension: Secondary | ICD-10-CM

## 2023-02-18 HISTORY — DX: Malignant (primary) neoplasm, unspecified: C80.1

## 2023-02-18 LAB — BASIC METABOLIC PANEL
Anion gap: 9 (ref 5–15)
BUN: 13 mg/dL (ref 8–23)
CO2: 23 mmol/L (ref 22–32)
Calcium: 9.2 mg/dL (ref 8.9–10.3)
Chloride: 104 mmol/L (ref 98–111)
Creatinine, Ser: 0.95 mg/dL (ref 0.61–1.24)
GFR, Estimated: >60 mL/min (ref >60)
Glucose, Bld: 108 mg/dL — ABNORMAL HIGH (ref 70–99)
Potassium: 4 mmol/L (ref 3.5–5.1)
Sodium: 136 mmol/L (ref 135–145)

## 2023-02-18 LAB — CBC
HCT: 46.7 % (ref 39.0–52.0)
Hemoglobin: 16.5 g/dL (ref 13.0–17.0)
MCH: 32.7 pg (ref 26.0–34.0)
MCHC: 35.3 g/dL (ref 30.0–36.0)
MCV: 92.5 fL (ref 80.0–100.0)
Platelets: 231 10*3/uL (ref 150–400)
RBC: 5.05 MIL/uL (ref 4.22–5.81)
RDW: 13 % (ref 11.5–15.5)
WBC: 7.5 10*3/uL (ref 4.0–10.5)
nRBC: 0 % (ref 0.0–0.2)

## 2023-02-18 NOTE — Patient Instructions (Signed)
SURGICAL WAITING ROOM VISITATION  Patients having surgery or a procedure may have no more than 2 support people in the waiting area - these visitors may rotate.    Children under the age of 56 must have an adult with them who is not the patient.  Due to an increase in RSV and influenza rates and associated hospitalizations, children ages 23 and under may not visit patients in [employer].  If the patient needs to stay at the hospital during part of their recovery, the visitor guidelines for inpatient rooms apply. Pre-op nurse will coordinate an appropriate time for 1 support person to accompany patient in pre-op.  This support person may not rotate.    Please refer to the [facility] website for the visitor guidelines for Inpatients (after your surgery is over and you are in a regular room).       Your procedure is scheduled on: 03-03-23   Report to [facility] Main Entrance    Report to admitting at         0900   AM   Call this number if you have problems the morning of surgery [phone]   Do not eat food  OR DRINK LIQUIDS :After Midnight.                           If you have questions, please contact your surgeon's office.   FOLLOW BOWEL PREP AND ANY ADDITIONAL PRE OP INSTRUCTIONS YOU RECEIVED FROM YOUR SURGEON'S OFFICE!!!   ONE BOTTLE 8OZ OF MAGNESIUM CITRATE BY NOON THE DAY BEFORE SURGERY ONE FLEETS ENEMA THE NIGHT BEFORE SURGERY   Oral Hygiene is also important to reduce your risk of infection.                                    Remember - BRUSH YOUR TEETH THE MORNING OF SURGERY WITH YOUR REGULAR TOOTHPASTE  DENTURES WILL BE REMOVED PRIOR TO SURGERY PLEASE DO NOT APPLY "Poly grip" OR ADHESIVES!!!   Do NOT smoke after Midnight   Take these medicines the morning of surgery with A SIP OF WATER: omeprazole, amlodipine, hydrocodone if needed    Bring CPAP mask and tubing day of surgery.                              You may not have any metal on  your body including hair pins, jewelry, and body piercing             Do not wear  lotions, powders, perfumes/cologne, or deodorant                Men may shave face and neck.   Do not bring valuables to the hospital. [place].   Contacts, glasses, dentures or bridgework may not be worn into surgery.   Bring small overnight bag day of surgery.   DO NOT [place]. PHARMACY WILL DISPENSE MEDICATIONS LISTED ON YOUR MEDICATION LIST TO YOU DURING YOUR ADMISSION [place]!    Patients discharged on the day of surgery will not be allowed to drive home.  Someone NEEDS to stay with you for the first 24 hours after anesthesia.  Special Instructions: Bring a copy of your healthcare power of attorney and living will documents the day of surgery if you haven't scanned them before.              Please read over the following fact sheets you were given: IF [place] [phone]   If you received a COVID test during your pre-op visit  it is requested that you wear a mask when out in public, stay away from anyone that may not be feeling well and notify your surgeon if you develop symptoms. If you test positive for Covid or have been in contact with anyone that has tested positive in the last 10 days please notify you surgeon.    [place] - Preparing for Surgery Before surgery, you can play an important role.  Because skin is not sterile, your skin needs to be as free of germs as possible.  You can reduce the number of germs on your skin by washing with CHG (chlorahexidine gluconate) soap before surgery.  CHG is an antiseptic cleaner which kills germs and bonds with the skin to continue killing germs even after washing. Please DO NOT use if you have an allergy to CHG or antibacterial soaps.  If your skin becomes reddened/irritated stop using the CHG and inform  your nurse when you arrive at Short Stay. Do not shave (including legs and underarms) for at least 48 hours prior to the first CHG shower.  You may shave your face/neck. Please follow these instructions carefully:  1.  Shower with CHG Soap the night before surgery and the  morning of Surgery.  2.  If you choose to wash your hair, wash your hair first as usual with your  normal  shampoo.  3.  After you shampoo, rinse your hair and body thoroughly to remove the  shampoo.                           4.  Use CHG as you would any other liquid soap.  You can apply chg directly  to the skin and wash                       Gently with a scrungie or clean washcloth.  5.  Apply the CHG Soap to your body ONLY FROM THE NECK DOWN.   Do not use on face/ open                           Wound or open sores. Avoid contact with eyes, ears mouth and genitals (private parts).                       Wash face,  Genitals (private parts) with your normal soap.             6.  Wash thoroughly, paying special attention to the area where your surgery  will be performed.  7.  Thoroughly rinse your body with warm water from the neck down.  8.  DO NOT shower/wash with your normal soap after using and rinsing off  the CHG Soap.                9.  Pat yourself dry with a clean towel.            10.  Wear clean pajamas.  11.  Place clean sheets on your bed the night of your first shower and do not  sleep with pets. Day of Surgery : Do not apply any lotions/deodorants the morning of surgery.  Please wear clean clothes to the hospital/surgery center.  FAILURE TO FOLLOW THESE INSTRUCTIONS MAY RESULT IN THE CANCELLATION OF YOUR SURGERY PATIENT SIGNATURE_________________________________  NURSE SIGNATURE__________________________________  ________________________________________________________________________

## 2023-02-18 NOTE — Progress Notes (Signed)
Pt. Stated no changes in meds since last done 12-2022 the patient. Was given pharmacy call #

## 2023-02-27 ENCOUNTER — Other Ambulatory Visit: Payer: Self-pay | Admitting: Hematology and Oncology

## 2023-02-27 ENCOUNTER — Inpatient Hospital Stay: Payer: Medicare Other | Attending: Hematology and Oncology

## 2023-02-27 DIAGNOSIS — M62838 Other muscle spasm: Secondary | ICD-10-CM | POA: Diagnosis not present

## 2023-02-27 DIAGNOSIS — M6281 Muscle weakness (generalized): Secondary | ICD-10-CM | POA: Diagnosis not present

## 2023-02-27 LAB — CBC WITH DIFFERENTIAL (CANCER CENTER ONLY)
Abs Immature Granulocytes: 0.03 10*3/uL (ref 0.00–0.07)
Basophils Absolute: 0.1 10*3/uL (ref 0.0–0.1)
Basophils Relative: 1 %
Eosinophils Absolute: 0.2 10*3/uL (ref 0.0–0.5)
Eosinophils Relative: 2 %
HCT: 46.7 % (ref 39.0–52.0)
Hemoglobin: 16.8 g/dL (ref 13.0–17.0)
Immature Granulocytes: 0 %
Lymphocytes Relative: 46 %
Lymphs Abs: 3.3 10*3/uL (ref 0.7–4.0)
MCH: 32.7 pg (ref 26.0–34.0)
MCHC: 36 g/dL (ref 30.0–36.0)
MCV: 90.9 fL (ref 80.0–100.0)
Monocytes Absolute: 0.4 10*3/uL (ref 0.1–1.0)
Monocytes Relative: 6 %
Neutro Abs: 3.2 10*3/uL (ref 1.7–7.7)
Neutrophils Relative %: 45 %
Platelet Count: 247 10*3/uL (ref 150–400)
RBC: 5.14 MIL/uL (ref 4.22–5.81)
RDW: 12.6 % (ref 11.5–15.5)
WBC Count: 7.1 10*3/uL (ref 4.0–10.5)
nRBC: 0 % (ref 0.0–0.2)

## 2023-02-27 LAB — CMP (CANCER CENTER ONLY)
ALT: 31 U/L (ref 0–44)
AST: 23 U/L (ref 15–41)
Albumin: 4.4 g/dL (ref 3.5–5.0)
Alkaline Phosphatase: 134 U/L — ABNORMAL HIGH (ref 38–126)
Anion gap: 7 (ref 5–15)
BUN: 13 mg/dL (ref 8–23)
CO2: 26 mmol/L (ref 22–32)
Calcium: 9.6 mg/dL (ref 8.9–10.3)
Chloride: 105 mmol/L (ref 98–111)
Creatinine: 0.99 mg/dL (ref 0.61–1.24)
GFR, Estimated: >60 mL/min (ref >60)
Glucose, Bld: 134 mg/dL — ABNORMAL HIGH (ref 70–99)
Potassium: 3.9 mmol/L (ref 3.5–5.1)
Sodium: 138 mmol/L (ref 135–145)
Total Bilirubin: 0.8 mg/dL (ref 0.3–1.2)
Total Protein: 7.3 g/dL (ref 6.5–8.1)

## 2023-02-27 LAB — FERRITIN: Ferritin: 53 ng/mL (ref 24–336)

## 2023-02-28 NOTE — H&P (Signed)
Office Visit Report     02/18/2023   --------------------------------------------------------------------------------   Stuart Gaines  MRN: [id number]  DOB: 1956/11/04, 67 year old Male  SSN: [id number]   PRIMARY CARE:  Robert A. Nicholos Johns Retired, MD  PRIMARY CARE FAX:  [phone]  REFERRING:  Azucena Kuba  PROVIDER:  Bjorn Pippin, M.D.  TREATING:  Pecola Leisure [place], [place]  LOCATION:  [facility] [phone]     --------------------------------------------------------------------------------   CC/HPI: Pt presents today for pre-operative history and physical exam in anticipation of robotic assisted lap radical prostatectomy with bilateral pelvic lymph node dissection by Dr. Laverle Patter on 03/03/23. He is doing well and is without complaint. His foot is healing but he is still in a boot and using a crutch. He is able to ambulate.   Pt denies F/C, HA, CP, SOB, N/V, diarrhea/constipation, back pain, flank pain, hematuria, and dysuria.     HX:   CC: Prostate Cancer   Mr. Stuart Gaines is a 67 year old gentleman who was found to have an elevated PSA of 8.04 prompting an MRI of the prostate on 10/13/22. This demonstrated a PI-RADS 5 lesion of the right anterior transition zone of the prostate (mid/apex) with bulging of the capsule in this area suggestive of EPE. He underwent an MR/US fusion biopsy on 11/27/22 that confirmed 3 out of 3 targeted MR biopsies to be positive for Gleason 4+3=7 adenocarcinoma with all systemic biopsies negative for malignancy. He was seen by me in the prostate multidisciplinary clinic in January and has elected to proceed with surgical therapy. He follows up today to review his PSMA PET scan results and for a preoperative exam.   Family history: None.   Imaging studies: MRI (10/13/22) - No EPE, SVI, or LAD, or bone lesions.   PMH: He has a history of chronic pain (on hydrocodone), hypertension, GERD, hyperlipidemia, asthma, and liver disease  (hemochromotosis).  PSH: Laparoscopic cholecystectomy and open appendectomy.   TNM stage: cT3a Nx Mx  PSA: 8.04  Gleason score: 4+3=7 (GG 3)  Biopsy (11/27/22): 3/15 cores positive  Left: Benign  Right: Benign  MR target: 3/3 cores (4+3=7, 80%, 30%, 30%, PNI)  Prostate volume: 31.0 cc   Nomogram  OC disease: 51%  EPE: 45%  SVI: 6%  LNI: 8%  PFS (5 year, 10 year): 65%, 50%   Urinary function: IPSS is 7.  Erectile function: SHIM score is 5. He has not been sexually active since the late 90s when he got divorced. However, he feels that his erectile function is quite good. That being said, erectile function is a low priority for him.   Interval history:   He returns today after having undergone his PSMA PET scan for staging. He did have surgery on his right foot in mid February. He is still in a cast at this point as his surgery turned out to be more extensive than anticipated. He is unsure when he will be in a weightbearing boot. He is currently scheduled for his robotic prostatectomy and bilateral pelvic lymphadenectomy on 02/03/2023. He otherwise remains in stable health.     ALLERGIES: Ampicillin - sweating     Notes: oxycodone "makes me angry"   MEDICATIONS: Lisinopril  Omeprazole  Amlodipine Besilate  Atorvastatin Calcium  Diclofenac  Hydrocodone-Acetaminophen  Ventolin Hfa     GU PSH: ESWL - about 2006 Prostate Needle Biopsy - 11/27/2022       PSH Notes: ganglion cyst     NON-GU  PSH: Appendectomy Foot/toes Surgery Procedure Forearm Or Wrist Surgery Incise Gallbladder Knee Arthroscopy/surgery Low Back Disk Surgery Remove Gallbladder Surgical Pathology, Gross And Microscopic Examination For Prostate Needle - 11/27/2022     GU PMH: Stress Incontinence - 01/28/2023 Prostate Cancer - 01/21/2023, - 01/01/2023, - 12/10/2022 Elevated PSA - 11/27/2022, His PSA is up further but his exam remains benign. I am going to get an MRIP on him and if it is positive, he will return  for a fusion biopsy, but if negative he will need a standard biopsy. I reviewed the risks of bleeding, infection and voiding difficulty. He has had prior issues with quinolones so I will send bactrim for the prep, but he will need Rocephin as well. , - 09/16/2022, His PSA is down slightly to 3.91. I will just have him return in 6 months with a PSA for an exam. I will get a testosterone as well since he is on the chronic pain med. , - 2021, His PSA is up to 4.19 but it was 3.79 in 2019 and his exam is benign. I am going to have him return in 65months with a repeat PSA and if it is still rising, I will set him up for a biopsy. I reviewed the risks of bleeding, infection and voiding difficulty. I also discussed the ExoDx test which could give [place] more information about his risk level. , - 2021 BPH w/o LUTS - 09/16/2022 BPH w/LUTS, He has mild LUTS but no need for treatment. - 2021 Nocturia - 2021      PMH Notes:   1) Prostate cancer: He is s/p a UNS RAL radical prostatectomy and BPLND on 02/03/23.   Diagnosis:  Pretreatment PSA: 8.04  Pretreatment SHIM score: 5 (not sexually active)   NON-GU PMH: Muscle weakness (generalized) - 01/28/2023, - 01/01/2023 Other muscle spasm - 01/28/2023 Bacteriuria - 01/21/2023 Arthritis Asthma GERD Hypercholesterolemia Hypertension Liver Disease Other chronic pain Other hemochromatosis    FAMILY HISTORY: 1 Daughter - Other 1 son - Other Cancer - Father, Brother Diabetes - Runs in Family Hematuria - Father Strokes - Mother, Father   SOCIAL HISTORY: Marital Status: Divorced Preferred Language: English Current Smoking Status: Patient has never smoked.   Tobacco Use Assessment Completed: Used Tobacco in last 30 days? Does not use smokeless tobacco. Has never drank.  Does not use drugs. Drinks 2 caffeinated drinks per day. Has not had a blood transfusion. Patient's occupation is/was Disabled.    REVIEW OF SYSTEMS:    GU Review Male:   Patient denies  frequent urination, hard to postpone urination, burning/ pain with urination, get up at night to urinate, leakage of urine, stream starts and stops, trouble starting your stream, have to strain to urinate , erection problems, and penile pain.  Gastrointestinal (Upper):   Patient denies nausea, vomiting, and indigestion/ heartburn.  Gastrointestinal (Lower):   Patient denies diarrhea and constipation.  Constitutional:   Patient denies fever, night sweats, weight loss, and fatigue.  Skin:   Patient denies skin rash/ lesion and itching.  Eyes:   Patient denies blurred vision and double vision.  Ears/ Nose/ Throat:   Patient denies sore throat and sinus problems.  Hematologic/Lymphatic:   Patient denies swollen glands and easy bruising.  Cardiovascular:   Patient denies leg swelling and chest pains.  Respiratory:   Patient denies cough and shortness of breath.  Endocrine:   Patient denies excessive thirst.  Musculoskeletal:   Patient denies back pain and joint pain.  Neurological:  Patient denies headaches and dizziness.  Psychologic:   Patient denies depression and anxiety.   VITAL SIGNS:      02/18/2023 02:56 PM  Weight 227 lb / 102.97 kg  Height 70 in / 177.8 cm  BP 121/79 mmHg  Heart Rate 96 /min  Temperature 98.2 F / 36.7 C  BMI 32.6 kg/m   MULTI-SYSTEM PHYSICAL EXAMINATION:    Constitutional: Well-nourished. No physical deformities. Normally developed. Good grooming.  Neck: Neck symmetrical, not swollen. Normal tracheal position.  Respiratory: Normal breath sounds. No labored breathing, no use of accessory muscles.   Cardiovascular: Regular rate and rhythm. No murmur, no gallop.  Lymphatic: No enlargement of neck, axillae, groin.  Skin: No paleness, no jaundice, no cyanosis. No lesion, no ulcer, no rash.  Neurologic / Psychiatric: Oriented to time, oriented to place, oriented to person. No depression, no anxiety, no agitation.  Gastrointestinal: No mass, no tenderness, no  rigidity, obese abdomen.   Eyes: Normal conjunctivae. Normal eyelids.  Ears, Nose, Mouth, and Throat: Left ear no scars, no lesions, no masses. Right ear no scars, no lesions, no masses. Nose no scars, no lesions, no masses. Normal hearing. Normal lips.  Musculoskeletal: Normal gait and station of head and neck.     Complexity of Data:  Records Review:   Previous Patient Records  Urine Test Review:   Urinalysis   02/18/23  Urinalysis  Urine Appearance Clear   Urine Color Yellow   Urine Glucose Neg mg/dL  Urine Bilirubin Neg mg/dL  Urine Ketones Neg mg/dL  Urine Specific Gravity 1.020   Urine Blood Neg ery/uL  Urine pH 6.5   Urine Protein Neg mg/dL  Urine Urobilinogen 0.2 mg/dL  Urine Nitrites Neg   Urine Leukocyte Esterase Neg leu/uL   PROCEDURES:          Urinalysis - 81003 Dipstick Dipstick Cont'd  Specimen: Patient Cath Bilirubin: Neg mg/dL  Color: Yellow Ketones: Neg mg/dL  Appearance: Clear Blood: Neg ery/uL  Specific Gravity: 1.020 Protein: Neg mg/dL  pH: 6.5 Urobilinogen: 0.2 mg/dL  Glucose: Neg mg/dL Nitrites: Neg    Leukocyte Esterase: Neg leu/uL    ASSESSMENT:      ICD-10 Details  1 GU:   Prostate Cancer - C61    PLAN:           Schedule Return Visit/Planned Activity: Keep Scheduled Appointment - Schedule Surgery          Document Letter(s):  Created for Patient: Clinical Summary         Notes:   There are no changes in the patients history or physical exam since last evaluation by Dr. Laverle Patter. Pt is scheduled to undergo RALP with BPLND on 03/03/23.   All pt's questions were answered to the best of my ability.          Next Appointment:      Next Appointment: 02/27/2023 11:00 AM    Appointment Type: 60 Physical Therapy    Location: [facility] [phone]    Provider: Lavona Mound    Reason for Visit: PT/ultrasound      * Signed by Ulyses Amor, PA on 02/18/23 at 3:33 PM (EDT)*

## 2023-03-01 NOTE — Anesthesia Preprocedure Evaluation (Signed)
Anesthesia Evaluation  Patient identified by MRN, date of birth, ID band Patient awake    Reviewed: Allergy & Precautions, NPO status , Patient's Chart, lab work & pertinent test results  History of Anesthesia Complications (+) history of anesthetic complications (agitated and combative with back surgeries)  Airway        Dental   Pulmonary asthma , former smoker          Cardiovascular hypertension (amlodipine, lisinopril), Pt. on medications   TTE 12/15/2019: IMPRESSIONS     1. Left ventricular ejection fraction, by visual estimation, is 60 to  65%. The left ventricle has normal function. There is no left ventricular  hypertrophy.   2. Left ventricular diastolic parameters are consistent with Grade I  diastolic dysfunction (impaired relaxation).   3. The average left ventricular global longitudinal strain is -23.0 %.   4. Global right ventricle has normal systolic function.The right  ventricular size is normal. No increase in right ventricular wall  thickness.   5. Left atrial size was normal.   6. Right atrial size was normal.   7. The mitral valve is grossly normal. Trivial mitral valve  regurgitation.   8. The tricuspid valve is grossly normal.   9. The tricuspid valve is grossly normal. Tricuspid valve regurgitation  is trivial.  10. The aortic valve is tricuspid. Aortic valve regurgitation is not  visualized.  11. The pulmonic valve was grossly normal. Pulmonic valve regurgitation is  trivial.  12. The inferior vena cava is normal in size with greater than 50%  respiratory variability, suggesting right atrial pressure of 3 mmHg.     Neuro/Psych  Neuromuscular disease (chronic back pain)    GI/Hepatic ,GERD  Medicated,,  Endo/Other    Renal/GU      Musculoskeletal  (+) Arthritis , Osteoarthritis,    Abdominal   Peds  Hematology  (+) Blood dyscrasia (hemachromatosis)   Anesthesia Other Findings 67  yo man with prostate cancer presents for robotic assisted lap radical prostatectomy with bilateral pelvic lymph node dissection  Reproductive/Obstetrics                              Anesthesia Physical Anesthesia Plan  ASA: 3  Anesthesia Plan: General   Post-op Pain Management: Tylenol PO (pre-op)*   Induction: Intravenous  PONV Risk Score and Plan: 2 and Ondansetron, Dexamethasone and Treatment may vary due to age or medical condition  Airway Management Planned: Oral ETT  Additional Equipment:   Intra-op Plan:   Post-operative Plan: Extubation in OR  Informed Consent:      Dental advisory given  Plan Discussed with: CRNA and Anesthesiologist  Anesthesia Plan Comments: (Risks of general anesthesia discussed including, but not limited to, sore throat, hoarse voice, chipped/damaged teeth, injury to vocal cords, nausea and vomiting, allergic reactions, lung infection, heart attack, stroke, and death. All questions answered. )         Anesthesia Quick Evaluation

## 2023-03-03 ENCOUNTER — Observation Stay (HOSPITAL_COMMUNITY)
Admission: RE | Admit: 2023-03-03 | Discharge: 2023-03-04 | Disposition: A | Discharge status: Home / Self Care | Payer: Medicare Other | Attending: Urology | Admitting: Urology

## 2023-03-03 ENCOUNTER — Other Ambulatory Visit: Payer: Self-pay

## 2023-03-03 ENCOUNTER — Encounter (HOSPITAL_COMMUNITY)
Admission: RE | Disposition: A | Discharge status: Home / Self Care | Payer: Self-pay | Source: Home / Self Care | Attending: Urology

## 2023-03-03 ENCOUNTER — Ambulatory Visit (HOSPITAL_COMMUNITY): Payer: Medicare Other | Admitting: Physician Assistant

## 2023-03-03 ENCOUNTER — Encounter (HOSPITAL_COMMUNITY): Payer: Self-pay | Admitting: Urology

## 2023-03-03 ENCOUNTER — Telehealth: Payer: Self-pay | Admitting: *Deleted

## 2023-03-03 ENCOUNTER — Ambulatory Visit (HOSPITAL_BASED_OUTPATIENT_CLINIC_OR_DEPARTMENT_OTHER): Payer: Medicare Other | Admitting: Certified Registered"

## 2023-03-03 DIAGNOSIS — Z87891 Personal history of nicotine dependence: Secondary | ICD-10-CM

## 2023-03-03 DIAGNOSIS — C61 Malignant neoplasm of prostate: Secondary | ICD-10-CM

## 2023-03-03 DIAGNOSIS — Z79899 Other long term (current) drug therapy: Secondary | ICD-10-CM | POA: Diagnosis not present

## 2023-03-03 DIAGNOSIS — D360 Benign neoplasm of lymph nodes: Secondary | ICD-10-CM | POA: Insufficient documentation

## 2023-03-03 DIAGNOSIS — J45909 Unspecified asthma, uncomplicated: Secondary | ICD-10-CM | POA: Insufficient documentation

## 2023-03-03 DIAGNOSIS — M1990 Unspecified osteoarthritis, unspecified site: Secondary | ICD-10-CM | POA: Diagnosis not present

## 2023-03-03 DIAGNOSIS — I10 Essential (primary) hypertension: Secondary | ICD-10-CM | POA: Insufficient documentation

## 2023-03-03 HISTORY — PX: LYMPHADENECTOMY: SHX5960

## 2023-03-03 HISTORY — PX: ROBOT ASSISTED LAPAROSCOPIC RADICAL PROSTATECTOMY: SHX5141

## 2023-03-03 LAB — HEMOGLOBIN AND HEMATOCRIT, BLOOD
HCT: 45.2 % (ref 39.0–52.0)
Hemoglobin: 16 g/dL (ref 13.0–17.0)

## 2023-03-03 SURGERY — XI ROBOTIC ASSISTED LAPAROSCOPIC RADICAL PROSTATECTOMY LEVEL 2
Anesthesia: General

## 2023-03-03 MED ORDER — ONDANSETRON HCL 4 MG/2ML IJ SOLN: 4.0000 mg | INTRAMUSCULAR | Status: DC | PRN

## 2023-03-03 MED ORDER — FLEET ENEMA 7-19 GM/118ML RE ENEM: 1.0000 | ENEMA | Freq: Once | RECTAL | Status: DC

## 2023-03-03 MED ORDER — DOCUSATE SODIUM 100 MG PO CAPS
100.0000 mg | ORAL_CAPSULE | Freq: Two times a day (BID) | ORAL | Status: DC
  Administered 2023-03-03 – 2023-03-04 (×3): 100 mg via ORAL
  Filled 2023-03-03 (×3): qty 1

## 2023-03-03 MED ORDER — LACTATED RINGERS IV SOLN: INTRAVENOUS | Status: DC | PRN

## 2023-03-03 MED ORDER — DEXMEDETOMIDINE HCL IN NACL 80 MCG/20ML IV SOLN
INTRAVENOUS | Status: AC
  Filled 2023-03-03: qty 20

## 2023-03-03 MED ORDER — PROPOFOL 10 MG/ML IV BOLUS
INTRAVENOUS | Status: AC
  Filled 2023-03-03: qty 20

## 2023-03-03 MED ORDER — HEMOSTATIC AGENTS (NO CHARGE) OPTIME
TOPICAL | Status: DC | PRN
  Administered 2023-03-03: 1 via TOPICAL

## 2023-03-03 MED ORDER — KETOROLAC TROMETHAMINE 15 MG/ML IJ SOLN
15.0000 mg | Freq: Four times a day (QID) | INTRAMUSCULAR | Status: DC
  Administered 2023-03-03 – 2023-03-04 (×4): 15 mg via INTRAVENOUS
  Filled 2023-03-03 (×4): qty 1

## 2023-03-03 MED ORDER — PHENYLEPHRINE HCL (PRESSORS) 10 MG/ML IV SOLN
INTRAVENOUS | Status: AC
  Filled 2023-03-03: qty 1

## 2023-03-03 MED ORDER — CHLORHEXIDINE GLUCONATE 0.12 % MT SOLN
15.0000 mL | Freq: Once | OROMUCOSAL | Status: AC
  Administered 2023-03-03: 15 mL via OROMUCOSAL

## 2023-03-03 MED ORDER — DIPHENHYDRAMINE HCL 50 MG/ML IJ SOLN: 12.5000 mg | Freq: Four times a day (QID) | INTRAMUSCULAR | Status: DC | PRN

## 2023-03-03 MED ORDER — AMISULPRIDE (ANTIEMETIC) 5 MG/2ML IV SOLN: 10.0000 mg | Freq: Once | INTRAVENOUS | Status: DC | PRN

## 2023-03-03 MED ORDER — ORAL CARE MOUTH RINSE: 15.0000 mL | Freq: Once | OROMUCOSAL | Status: DC

## 2023-03-03 MED ORDER — CHLORHEXIDINE GLUCONATE 0.12 % MT SOLN: 15.0000 mL | Freq: Once | OROMUCOSAL | Status: DC

## 2023-03-03 MED ORDER — ZOLPIDEM TARTRATE 5 MG PO TABS: 5.0000 mg | ORAL_TABLET | Freq: Every evening | ORAL | Status: DC | PRN

## 2023-03-03 MED ORDER — MIDAZOLAM HCL 2 MG/2ML IJ SOLN
INTRAMUSCULAR | Status: AC
  Filled 2023-03-03: qty 2

## 2023-03-03 MED ORDER — ATORVASTATIN CALCIUM 40 MG PO TABS
40.0000 mg | ORAL_TABLET | Freq: Every day | ORAL | Status: DC
  Administered 2023-03-03: 40 mg via ORAL
  Filled 2023-03-03: qty 1

## 2023-03-03 MED ORDER — DEXAMETHASONE SODIUM PHOSPHATE 10 MG/ML IJ SOLN
INTRAMUSCULAR | Status: DC | PRN
  Administered 2023-03-03: 10 mg via INTRAVENOUS

## 2023-03-03 MED ORDER — PROPOFOL 10 MG/ML IV BOLUS
INTRAVENOUS | Status: DC | PRN
  Administered 2023-03-03: 200 mg via INTRAVENOUS

## 2023-03-03 MED ORDER — CEFAZOLIN SODIUM-DEXTROSE 1-4 GM/50ML-% IV SOLN
1.0000 g | Freq: Three times a day (TID) | INTRAVENOUS | Status: AC
  Administered 2023-03-04: 1 g via INTRAVENOUS
  Filled 2023-03-03: qty 50

## 2023-03-03 MED ORDER — FENTANYL CITRATE (PF) 250 MCG/5ML IJ SOLN
INTRAMUSCULAR | Status: AC
  Filled 2023-03-03: qty 5

## 2023-03-03 MED ORDER — MORPHINE SULFATE (PF) 2 MG/ML IV SOLN
2.0000 mg | INTRAVENOUS | Status: DC | PRN
  Administered 2023-03-03: 2 mg via INTRAVENOUS
  Filled 2023-03-03: qty 1

## 2023-03-03 MED ORDER — MAGNESIUM CITRATE PO SOLN: 1.0000 | Freq: Once | ORAL | Status: DC

## 2023-03-03 MED ORDER — ROCURONIUM BROMIDE 10 MG/ML (PF) SYRINGE
PREFILLED_SYRINGE | INTRAVENOUS | Status: AC
  Filled 2023-03-03: qty 20

## 2023-03-03 MED ORDER — CEFAZOLIN SODIUM-DEXTROSE 2-4 GM/100ML-% IV SOLN
INTRAVENOUS | Status: AC
  Filled 2023-03-03: qty 100

## 2023-03-03 MED ORDER — ACETAMINOPHEN 325 MG PO TABS: 650.0000 mg | ORAL_TABLET | ORAL | Status: DC | PRN

## 2023-03-03 MED ORDER — LACTATED RINGERS IV SOLN: INTRAVENOUS | Status: DC

## 2023-03-03 MED ORDER — ORAL CARE MOUTH RINSE: 15.0000 mL | Freq: Once | OROMUCOSAL | Status: AC

## 2023-03-03 MED ORDER — FENTANYL CITRATE (PF) 250 MCG/5ML IJ SOLN
INTRAMUSCULAR | Status: DC | PRN
  Administered 2023-03-03 (×3): 50 ug via INTRAVENOUS
  Administered 2023-03-03 (×2): 100 ug via INTRAVENOUS
  Administered 2023-03-03: 50 ug via INTRAVENOUS
  Administered 2023-03-03: 100 ug via INTRAVENOUS

## 2023-03-03 MED ORDER — CEFAZOLIN SODIUM-DEXTROSE 2-4 GM/100ML-% IV SOLN
2.0000 g | Freq: Once | INTRAVENOUS | Status: AC
  Administered 2023-03-03: 2 g via INTRAVENOUS

## 2023-03-03 MED ORDER — PHENYLEPHRINE 80 MCG/ML (10ML) SYRINGE FOR IV PUSH (FOR BLOOD PRESSURE SUPPORT)
PREFILLED_SYRINGE | INTRAVENOUS | Status: AC
  Filled 2023-03-03: qty 10

## 2023-03-03 MED ORDER — EPHEDRINE 5 MG/ML INJ
INTRAVENOUS | Status: AC
  Filled 2023-03-03: qty 5

## 2023-03-03 MED ORDER — LIDOCAINE HCL (PF) 2 % IJ SOLN
INTRAMUSCULAR | Status: AC
  Filled 2023-03-03: qty 5

## 2023-03-03 MED ORDER — MIDAZOLAM HCL 2 MG/2ML IJ SOLN
INTRAMUSCULAR | Status: DC | PRN
  Administered 2023-03-03 (×2): 1 mg via INTRAVENOUS

## 2023-03-03 MED ORDER — LISINOPRIL 10 MG PO TABS
10.0000 mg | ORAL_TABLET | Freq: Every day | ORAL | Status: DC
  Administered 2023-03-03 – 2023-03-04 (×2): 10 mg via ORAL
  Filled 2023-03-03 (×2): qty 1

## 2023-03-03 MED ORDER — ENOXAPARIN SODIUM 40 MG/0.4ML IJ SOSY
40.0000 mg | PREFILLED_SYRINGE | INTRAMUSCULAR | Status: DC
  Administered 2023-03-04: 40 mg via SUBCUTANEOUS
  Filled 2023-03-03: qty 0.4

## 2023-03-03 MED ORDER — HEPARIN SODIUM (PORCINE) 1000 UNIT/ML IJ SOLN
INTRAMUSCULAR | Status: AC
  Filled 2023-03-03: qty 1

## 2023-03-03 MED ORDER — BUPIVACAINE-EPINEPHRINE 0.25% -1:200000 IJ SOLN
INTRAMUSCULAR | Status: AC
  Filled 2023-03-03: qty 1

## 2023-03-03 MED ORDER — DIPHENHYDRAMINE HCL 12.5 MG/5ML PO ELIX: 12.5000 mg | ORAL_SOLUTION | Freq: Four times a day (QID) | ORAL | Status: DC | PRN

## 2023-03-03 MED ORDER — STERILE WATER FOR IRRIGATION IR SOLN
Status: DC | PRN
  Administered 2023-03-03: 1000 mL

## 2023-03-03 MED ORDER — ARTIFICIAL TEARS OPHTHALMIC OINT
TOPICAL_OINTMENT | OPHTHALMIC | Status: DC | PRN
  Filled 2023-03-03: qty 3.5

## 2023-03-03 MED ORDER — SUGAMMADEX SODIUM 200 MG/2ML IV SOLN
INTRAVENOUS | Status: DC | PRN
  Administered 2023-03-03: 400 mg via INTRAVENOUS

## 2023-03-03 MED ORDER — LIDOCAINE 2% (20 MG/ML) 5 ML SYRINGE
INTRAMUSCULAR | Status: DC | PRN
  Administered 2023-03-03: 100 mg via INTRAVENOUS

## 2023-03-03 MED ORDER — ALBUTEROL SULFATE (2.5 MG/3ML) 0.083% IN NEBU: 2.5000 mg | INHALATION_SOLUTION | RESPIRATORY_TRACT | Status: DC | PRN

## 2023-03-03 MED ORDER — ACETAMINOPHEN 500 MG PO TABS
1000.0000 mg | ORAL_TABLET | Freq: Once | ORAL | Status: AC
  Administered 2023-03-03: 1000 mg via ORAL
  Filled 2023-03-03: qty 2

## 2023-03-03 MED ORDER — ROCURONIUM BROMIDE 10 MG/ML (PF) SYRINGE
PREFILLED_SYRINGE | INTRAVENOUS | Status: DC | PRN
  Administered 2023-03-03: 30 mg via INTRAVENOUS
  Administered 2023-03-03 (×2): 10 mg via INTRAVENOUS
  Administered 2023-03-03: 60 mg via INTRAVENOUS

## 2023-03-03 MED ORDER — BUPIVACAINE-EPINEPHRINE 0.25% -1:200000 IJ SOLN
INTRAMUSCULAR | Status: DC | PRN
  Administered 2023-03-03: 38 mL

## 2023-03-03 MED ORDER — ONDANSETRON HCL 4 MG/2ML IJ SOLN
INTRAMUSCULAR | Status: AC
  Filled 2023-03-03: qty 2

## 2023-03-03 MED ORDER — DEXMEDETOMIDINE HCL IN NACL 80 MCG/20ML IV SOLN
INTRAVENOUS | Status: DC | PRN
  Administered 2023-03-03 (×2): 4 ug via BUCCAL
  Administered 2023-03-03: 8 ug via BUCCAL

## 2023-03-03 MED ORDER — LACTATED RINGERS IV SOLN
INTRAVENOUS | Status: DC | PRN
  Administered 2023-03-03: 1000 mL

## 2023-03-03 MED ORDER — FENTANYL CITRATE PF 50 MCG/ML IJ SOSY: 25.0000 ug | PREFILLED_SYRINGE | INTRAMUSCULAR | Status: DC | PRN

## 2023-03-03 MED ORDER — SODIUM CHLORIDE 0.9 % IR SOLN
Status: DC | PRN
  Administered 2023-03-03: 1000 mL

## 2023-03-03 MED ORDER — CEFAZOLIN SODIUM-DEXTROSE 1-4 GM/50ML-% IV SOLN
1.0000 g | Freq: Three times a day (TID) | INTRAVENOUS | Status: DC
  Administered 2023-03-03: 1 g via INTRAVENOUS
  Filled 2023-03-03 (×2): qty 50

## 2023-03-03 MED ORDER — TRIPLE ANTIBIOTIC 3.5-400-5000 EX OINT: 1.0000 | TOPICAL_OINTMENT | Freq: Three times a day (TID) | CUTANEOUS | Status: DC | PRN

## 2023-03-03 MED ORDER — POLYVINYL ALCOHOL 1.4 % OP SOLN
1.0000 [drp] | OPHTHALMIC | Status: DC
  Administered 2023-03-03: 1 [drp] via OPHTHALMIC
  Filled 2023-03-03: qty 15

## 2023-03-03 MED ORDER — HYOSCYAMINE SULFATE 0.125 MG SL SUBL: 0.1250 mg | SUBLINGUAL_TABLET | Freq: Four times a day (QID) | SUBLINGUAL | Status: DC | PRN

## 2023-03-03 MED ORDER — SULFAMETHOXAZOLE-TRIMETHOPRIM 800-160 MG PO TABS: 1.0000 | ORAL_TABLET | Freq: Two times a day (BID) | ORAL | 0 refills | Status: DC

## 2023-03-03 MED ORDER — DOCUSATE SODIUM 100 MG PO CAPS: 100.0000 mg | ORAL_CAPSULE | Freq: Two times a day (BID) | ORAL | Status: DC

## 2023-03-03 MED ORDER — HYDROCODONE-ACETAMINOPHEN 7.5-325 MG PO TABS: 0.5000 | ORAL_TABLET | Freq: Four times a day (QID) | ORAL | 0 refills | Status: DC | PRN

## 2023-03-03 MED ORDER — AMLODIPINE BESYLATE 5 MG PO TABS
5.0000 mg | ORAL_TABLET | Freq: Every morning | ORAL | Status: DC
  Administered 2023-03-03 – 2023-03-04 (×2): 5 mg via ORAL
  Filled 2023-03-03 (×2): qty 1

## 2023-03-03 MED ORDER — SODIUM CHLORIDE 0.9 % IV BOLUS
1000.0000 mL | Freq: Once | INTRAVENOUS | Status: AC
  Administered 2023-03-03: 1000 mL via INTRAVENOUS

## 2023-03-03 MED ORDER — PANTOPRAZOLE SODIUM 40 MG PO TBEC
40.0000 mg | DELAYED_RELEASE_TABLET | Freq: Every day | ORAL | Status: DC
  Administered 2023-03-03 – 2023-03-04 (×2): 40 mg via ORAL
  Filled 2023-03-03 (×2): qty 1

## 2023-03-03 MED ORDER — DEXAMETHASONE SODIUM PHOSPHATE 10 MG/ML IJ SOLN
INTRAMUSCULAR | Status: AC
  Filled 2023-03-03: qty 1

## 2023-03-03 MED ORDER — KCL IN DEXTROSE-NACL 20-5-0.45 MEQ/L-%-% IV SOLN
INTRAVENOUS | Status: DC
  Filled 2023-03-03 (×4): qty 1000

## 2023-03-03 MED ORDER — ONDANSETRON HCL 4 MG/2ML IJ SOLN
INTRAMUSCULAR | Status: DC | PRN
  Administered 2023-03-03: 4 mg via INTRAVENOUS

## 2023-03-03 SURGICAL SUPPLY — 69 items
ADH SKN CLS APL DERMABOND .7 (GAUZE/BANDAGES/DRESSINGS) ×2
APL PRP STRL LF DISP 70% ISPRP (MISCELLANEOUS) ×2
APL SWBSTK 6 STRL LF DISP (MISCELLANEOUS) ×2
APPLICATOR COTTON TIP 6 STRL (MISCELLANEOUS) ×2 IMPLANT
APPLICATOR COTTON TIP 6IN STRL (MISCELLANEOUS) ×2
BAG COUNTER SPONGE SURGICOUNT (BAG) IMPLANT
BAG SPNG CNTER NS LX DISP (BAG)
CATH FOLEY 2WAY SLVR 18FR 30CC (CATHETERS) ×2 IMPLANT
CATH ROBINSON RED A/P 16FR (CATHETERS) ×2 IMPLANT
CATH ROBINSON RED A/P 8FR (CATHETERS) ×2 IMPLANT
CATH TIEMANN FOLEY 18FR 5CC (CATHETERS) ×2 IMPLANT
CHLORAPREP W/TINT 26 (MISCELLANEOUS) ×2 IMPLANT
CLIP LIGATING HEM O LOK PURPLE (MISCELLANEOUS) ×2 IMPLANT
COVER SURGICAL LIGHT HANDLE (MISCELLANEOUS) ×2 IMPLANT
COVER TIP SHEARS 8 DVNC (MISCELLANEOUS) ×2 IMPLANT
CUTTER ECHEON FLEX ENDO 45 340 (ENDOMECHANICALS) ×2 IMPLANT
DERMABOND ADVANCED .7 DNX12 (GAUZE/BANDAGES/DRESSINGS) ×2 IMPLANT
DRAIN CHANNEL RND F F (WOUND CARE) IMPLANT
DRAPE ARM DVNC X/XI (DISPOSABLE) ×8 IMPLANT
DRAPE COLUMN DVNC XI (DISPOSABLE) ×2 IMPLANT
DRAPE SURG IRRIG POUCH 19X23 (DRAPES) ×2 IMPLANT
DRIVER NDL LRG 8 DVNC XI (INSTRUMENTS) ×4 IMPLANT
DRIVER NDLE LRG 8 DVNC XI (INSTRUMENTS) ×4 IMPLANT
DRSG TEGADERM 4X4.75 (GAUZE/BANDAGES/DRESSINGS) ×2 IMPLANT
ELECT PENCIL ROCKER SW 15FT (MISCELLANEOUS) ×2 IMPLANT
ELECT REM PT RETURN 15FT ADLT (MISCELLANEOUS) ×2 IMPLANT
FORCEPS BPLR LNG DVNC XI (INSTRUMENTS) ×2 IMPLANT
FORCEPS PROGRASP DVNC XI (FORCEP) ×2 IMPLANT
GAUZE 4X4 16PLY ~~LOC~~+RFID DBL (SPONGE) ×2 IMPLANT
GAUZE SPONGE 4X4 12PLY STRL (GAUZE/BANDAGES/DRESSINGS) ×2 IMPLANT
GLOVE BIO SURGEON STRL SZ 6.5 (GLOVE) ×2 IMPLANT
GLOVE SURG LX STRL 7.5 STRW (GLOVE) ×4 IMPLANT
GOWN SRG XL LVL 4 BRTHBL STRL (GOWNS) ×2 IMPLANT
GOWN STRL NON-REIN XL LVL4 (GOWNS) ×2
GOWN STRL REUS W/ TWL XL LVL3 (GOWN DISPOSABLE) ×4 IMPLANT
GOWN STRL REUS W/TWL XL LVL3 (GOWN DISPOSABLE) ×4
HEMOSTAT SURGICEL 2X3 (HEMOSTASIS) IMPLANT
HOLDER FOLEY CATH W/STRAP (MISCELLANEOUS) ×2 IMPLANT
IRRIG SUCT STRYKERFLOW 2 WTIP (MISCELLANEOUS) ×2
IRRIGATION SUCT STRKRFLW 2 WTP (MISCELLANEOUS) ×2 IMPLANT
IV LACTATED RINGERS 1000ML (IV SOLUTION) ×2 IMPLANT
KIT TURNOVER KIT A (KITS) IMPLANT
NDL SAFETY ECLIP 18X1.5 (MISCELLANEOUS) ×2 IMPLANT
PACK ROBOT UROLOGY CUSTOM (CUSTOM PROCEDURE TRAY) ×2 IMPLANT
RELOAD STAPLE 45 4.1 GRN THCK (STAPLE) ×2 IMPLANT
SCISSORS MNPLR CVD DVNC XI (INSTRUMENTS) ×2 IMPLANT
SEAL UNIV 5-12 XI (MISCELLANEOUS) ×6 IMPLANT
SET CYSTO W/LG BORE CLAMP LF (SET/KITS/TRAYS/PACK) IMPLANT
SET TUBE SMOKE EVAC HIGH FLOW (TUBING) ×2 IMPLANT
SOL ELECTROSURG ANTI STICK (MISCELLANEOUS) ×2
SOLUTION ELECTROSURG ANTI STCK (MISCELLANEOUS) ×2 IMPLANT
SPIKE FLUID TRANSFER (MISCELLANEOUS) ×2 IMPLANT
STAPLE RELOAD 45 GRN (STAPLE) ×2 IMPLANT
STAPLE RELOAD 45MM GREEN (STAPLE) ×2
SUT ETHILON 3 0 PS 1 (SUTURE) ×2 IMPLANT
SUT MNCRL 3 0 RB1 (SUTURE) ×2 IMPLANT
SUT MNCRL 3 0 VIOLET RB1 (SUTURE) ×2 IMPLANT
SUT MNCRL AB 4-0 PS2 18 (SUTURE) ×4 IMPLANT
SUT MONOCRYL 3 0 RB1 (SUTURE) ×4
SUT PDS PLUS AB 0 CT-2 (SUTURE) ×4 IMPLANT
SUT VIC AB 0 CT1 27 (SUTURE) ×4
SUT VIC AB 0 CT1 27XBRD ANTBC (SUTURE) ×4 IMPLANT
SUT VIC AB 2-0 SH 27 (SUTURE) ×4
SUT VIC AB 2-0 SH 27X BRD (SUTURE) ×2 IMPLANT
SYR 27GX1/2 1ML LL SAFETY (SYRINGE) ×2 IMPLANT
TOWEL OR NON WOVEN STRL DISP B (DISPOSABLE) ×2 IMPLANT
TROCAR Z THREAD OPTICAL 12X100 (TROCAR) IMPLANT
TROCAR Z-THREAD FIOS 5X100MM (TROCAR) IMPLANT
WATER STERILE IRR 1000ML POUR (IV SOLUTION) ×2 IMPLANT

## 2023-03-03 NOTE — Telephone Encounter (Signed)
-----   Message from [name], MD sent at 02/27/2023  3:11 PM EDT ----- Please let Mr. Stuart Gaines know that his ferritin was 53.  He is currently on target.  Will plan to see him back as scheduled in July. ----- Message ----- From: Leory Plowman, Lab In [place] Sent: 02/27/2023  10:06 AM EDT To: [name], MD

## 2023-03-03 NOTE — Anesthesia Procedure Notes (Signed)
Date/Time: 03/03/2023 10:41 AM  Performed by: Minerva Ends, CRNAOxygen Delivery Method: Simple face mask Placement Confirmation: positive ETCO2 and breath sounds checked- equal and bilateral Dental Injury: Teeth and Oropharynx as per pre-operative assessment

## 2023-03-03 NOTE — Plan of Care (Signed)
  Problem: Education: Goal: Knowledge of the procedure and recovery process will improve Outcome: Progressing   Problem: Bowel/Gastric: Goal: Gastrointestinal status for postoperative course will improve Outcome: Progressing   Problem: Pain Management: Goal: General experience of comfort will improve Outcome: Progressing   Problem: Skin Integrity: Goal: Demonstration of wound healing without infection will improve Outcome: Progressing   Problem: Urinary Elimination: Goal: Ability to avoid or minimize complications of infection will improve Outcome: Progressing Goal: Ability to achieve and maintain urine output will improve Outcome: Progressing Goal: Home care management will improve Outcome: Progressing   

## 2023-03-03 NOTE — Op Note (Signed)
Preoperative diagnosis: Clinically localized adenocarcinoma of the prostate (clinical stage T1c N0 M0)  Postoperative diagnosis: Clinically localized adenocarcinoma of the prostate (clinical stage T1c N0 M0)  Procedure:  Robotic assisted laparoscopic radical prostatectomy (left nerve sparing) Bilateral robotic assisted laparoscopic pelvic lymphadenectomy  Surgeon: Moody Bruins. M.D.  Assistant(s): Harrie Foreman, PA-C  An assistant was required for this surgical procedure.  The duties of the assistant included but were not limited to suctioning, passing suture, camera manipulation, retraction. This procedure would not be able to be performed without an Geophysicist/field seismologist.   Resident: Dr. Jillyn Ledger  Anesthesia: General  Complications: None  EBL: 175 mL  IVF:  1600 mL crystalloid  Specimens: Prostate and seminal vesicles Right pelvic lymph nodes Left pelvic lymph nodes  Disposition of specimens: Pathology  Drains: 20 Fr coude catheter # 19 Blake pelvic drain  Indication: Stuart Gaines is a 67 y.o. patient with clinically localized prostate cancer.  After a thorough review of the management options for treatment of prostate cancer, he elected to proceed with surgical therapy and the above procedure(s).  We have discussed the potential benefits and risks of the procedure, side effects of the proposed treatment, the likelihood of the patient achieving the goals of the procedure, and any potential problems that might occur during the procedure or recuperation. Informed consent has been obtained.  Description of procedure:  The patient was taken to the operating room and a general anesthetic was administered. He was given preoperative antibiotics, placed in the dorsal lithotomy position, and prepped and draped in the usual sterile fashion. Next a preoperative timeout was performed. A urethral catheter was placed into the bladder and a site was selected near the umbilicus for  placement of the camera port. This was placed using a standard open Hassan technique which allowed entry into the peritoneal cavity under direct vision and without difficulty. An 8 mm port was placed and a pneumoperitoneum established. The camera was then used to inspect the abdomen and there was no evidence of any intra-abdominal injuries or other abnormalities. The remaining abdominal ports were then placed. 8 mm robotic ports were placed in the right lower quadrant, left lower quadrant, and far left lateral abdominal wall. A 5 mm port was placed in the right upper quadrant and a 12 mm port was placed in the right lateral abdominal wall for laparoscopic assistance. All ports were placed under direct vision without difficulty. The surgical cart was then docked.   Utilizing the cautery scissors, the bladder was reflected posteriorly allowing entry into the space of Retzius and identification of the endopelvic fascia and prostate. The periprostatic fat was then removed from the prostate allowing full exposure of the endopelvic fascia. The endopelvic fascia was then incised from the apex back to the base of the prostate bilaterally and the underlying levator muscle fibers were swept laterally off the prostate thereby isolating the dorsal venous complex. The dorsal vein was then stapled and divided with a 45 mm Flex Echelon stapler. Attention then turned to the bladder neck which was divided anteriorly thereby allowing entry into the bladder and exposure of the urethral catheter. The catheter balloon was deflated and the catheter was brought into the operative field and used to retract the prostate anteriorly. The posterior bladder neck was then examined and was divided allowing further dissection between the bladder and prostate posteriorly until the vasa deferentia and seminal vessels were identified. The vasa deferentia were isolated, divided, and lifted anteriorly. The seminal vesicles were  dissected down to  their tips with care to control the seminal vascular arterial blood supply. These structures were then lifted anteriorly and the space between Denonvillier's fascia and the anterior rectum was developed with a combination of sharp and blunt dissection. This isolated the vascular pedicles of the prostate.  The lateral prostatic fascia on the left side of the prostate was then sharply incised allowing release of the neurovascular bundle. The vascular pedicle of the prostate on the left side was then ligated with Weck clips between the prostate and neurovascular bundle and divided with sharp cold scissor dissection resulting in neurovascular bundle preservation. On the right side, a wide non nerve sparing dissection was performed with Weck clips used to ligate the vascular pedicle of the prostate. The neurovascular bundle on the left side was then separated off the apex of the prostate and urethra.   The urethra was then sharply transected allowing the prostate specimen to be disarticulated. The pelvis was copiously irrigated and hemostasis was ensured. There was no evidence for rectal injury.  Attention then turned to the right pelvic sidewall. The fibrofatty tissue between the external iliac vein, confluence of the iliac vessels, hypogastric artery, and Cooper's ligament was dissected free from the pelvic sidewall with care to preserve the obturator nerve. Weck clips were used for lymphostasis and hemostasis. An identical procedure was performed on the contralateral side and the lymphatic packets were removed for permanent pathologic analysis.  Attention then turned to the urethral anastomosis. A 2-0 Vicryl slip knot was placed between Denonvillier's fascia, the posterior bladder neck, and the posterior urethra to reapproximate these structures. A double-armed 3-0 Monocryl suture was then used to perform a 360 running tension-free anastomosis between the bladder neck and urethra. A new urethral catheter was  then placed into the bladder and irrigated. There were no blood clots within the bladder and the anastomosis appeared to be watertight. A #19 Blake drain was then brought through the left lateral 8 mm port site and positioned appropriately within the pelvis. It was secured to the skin with a nylon suture. The surgical cart was then undocked. The right lateral 12 mm port site was closed at the fascial level with a 0 Vicryl suture placed laparoscopically. All remaining ports were then removed under direct vision. The prostate specimen was removed intact within the Endopouch retrieval bag via the periumbilical camera port site. This fascial opening was closed with two running 0 PDS sutures. 0.25% Marcaine was then injected into all port sites and all incisions were reapproximated at the skin level with 4-0 Monocryl subcuticular sutures and Dermabond. The patient appeared to tolerate the procedure well and without complications. The patient was able to be extubated and transferred to the recovery unit in satisfactory condition.   Moody Bruins MD

## 2023-03-03 NOTE — Plan of Care (Signed)
  Problem: Education: Goal: Knowledge of the procedure and recovery process will improve Outcome: Progressing   Problem: Pain Management: Goal: General experience of comfort will improve Outcome: Progressing   

## 2023-03-03 NOTE — Progress Notes (Signed)
Patient ID: Stuart Gaines, male   DOB: 01-24-1956, 67 y.o.   MRN: [id number]  Post-op note  Subjective: The patient is doing well.  Left eye irritation.  Objective: Vital signs in last 24 hours: Temp:  [97.5 F (36.4 C)-98.5 F (36.9 C)] 97.5 F (36.4 C) (04/15 1145) Pulse Rate:  [95-96] 95 (04/15 1145) Resp:  [10-18] 18 (04/15 1145) BP: (162-176)/(79-93) 176/93 (04/15 1145) SpO2:  [92 %-99 %] 96 % (04/15 1145) Weight:  [107 kg] 107 kg (04/15 0554)  Intake/Output from previous day: No intake/output data recorded. Intake/Output this shift: Total I/O In: 1700 [I.V.:1600; IV Piggyback:100] Out: 225 [Urine:50; Blood:175]  Physical Exam:  General: Alert and oriented. HEENT: Left eye red but no foreign body. Abdomen: Soft, Nondistended. Incisions: Clean and dry. GU: Urine clear.  Lab Results: Recent Labs    03/03/23 1135  HGB 16.0  HCT 45.2    Assessment/Plan: POD#0   1) Continue to monitor, ambulate, IS, lacrilube ordered   Moody Bruins. MD   LOS: 0 days   Crecencio Mc 03/03/2023, 3:31 PM

## 2023-03-03 NOTE — Interval H&P Note (Signed)
History and Physical Interval Note:  03/03/2023 7:00 AM  Stuart Gaines  has presented today for surgery, with the diagnosis of PROSTATE CANCER.  The various methods of treatment have been discussed with the patient and family. After consideration of risks, benefits and other options for treatment, the patient has consented to  Procedure(s) with comments: XI ROBOTIC ASSISTED LAPAROSCOPIC RADICAL PROSTATECTOMY LEVEL 2 (N/A) - 210 MINUTES NEEDED FOR CASE BILATERAL PELVIC LYMPHADENECTOMY (Bilateral) as a surgical intervention.  The patient's history has been reviewed, patient examined, no change in status, stable for surgery.  I have reviewed the patient's chart and labs.  Questions were answered to the patient's satisfaction.     [employer]

## 2023-03-03 NOTE — Telephone Encounter (Signed)
TCT patient regarding recent lab results.  No answer but was able to leave vm message for pt to return this call at his convenience to [phone]

## 2023-03-03 NOTE — Anesthesia Procedure Notes (Signed)
Procedure Name: Intubation Date/Time: 03/03/2023 7:30 AM  Performed by: Minerva Ends, CRNAPre-anesthesia Checklist: Patient identified, Emergency Drugs available, Suction available and Patient being monitored Patient Re-evaluated:Patient Re-evaluated prior to induction Oxygen Delivery Method: Circle System Utilized Preoxygenation: Pre-oxygenation with 100% oxygen Induction Type: IV induction Ventilation: Mask ventilation without difficulty Laryngoscope Size: Glidescope Grade View: Grade I Tube type: Oral Tube size: 7.5 mm Number of attempts: 1 Airway Equipment and Method: Stylet Placement Confirmation: ETT inserted through vocal cords under direct vision, positive ETCO2 and breath sounds checked- equal and bilateral Secured at: 21 cm Tube secured with: Tape Dental Injury: Teeth and Oropharynx as per pre-operative assessment  Comments: Smooth IV induction Allen-- intubation AM CRNA atraumatic-- DIFFICULT INTUBATION-- GLIDESCOPE-- atraumatic-- teeth and mouth as preop-- bilat BS

## 2023-03-03 NOTE — Transfer of Care (Signed)
Immediate Anesthesia Transfer of Care Note  Patient: Stuart Gaines  Procedure(s) Performed: XI ROBOTIC ASSISTED LAPAROSCOPIC RADICAL PROSTATECTOMY LEVEL 2 BILATERAL PELVIC LYMPHADENECTOMY (Bilateral)  Patient Location: PACU  Anesthesia Type:General  Level of Consciousness: sedated  Airway & Oxygen Therapy: Patient Spontanous Breathing and Patient connected to face mask oxygen  Post-op Assessment: Report given to RN and Post -op Vital signs reviewed and stable  Post vital signs: Reviewed and stable  Last Vitals:  Vitals Value Taken Time  BP    Temp    Pulse 95 03/03/23 1050  Resp 7 03/03/23 1050  SpO2 98 % 03/03/23 1050  Vitals shown include unvalidated device data.  Last Pain:  Vitals:   03/03/23 0601  TempSrc: (P) Oral  PainSc:          Complications: No notable events documented.

## 2023-03-03 NOTE — Discharge Instructions (Signed)

## 2023-03-03 NOTE — Anesthesia Postprocedure Evaluation (Signed)
Anesthesia Post Note  Patient: Stuart Gaines  Procedure(s) Performed: XI ROBOTIC ASSISTED LAPAROSCOPIC RADICAL PROSTATECTOMY LEVEL 2 BILATERAL PELVIC LYMPHADENECTOMY (Bilateral)     Patient location during evaluation: PACU Anesthesia Type: General Level of consciousness: awake Pain management: pain level controlled Vital Signs Assessment: post-procedure vital signs reviewed and stable Respiratory status: spontaneous breathing, nonlabored ventilation and respiratory function stable Cardiovascular status: blood pressure returned to baseline and stable Postop Assessment: no apparent nausea or vomiting Anesthetic complications: no  No notable events documented.  Last Vitals:  Vitals:   03/03/23 1145 03/03/23 1610  BP: (!) 176/93 133/74  Pulse: 95 (!) 102  Resp: 18 16  Temp: (!) 36.4 C 36.5 C  SpO2: 96% 95%    Last Pain:  Vitals:   03/03/23 1610  TempSrc: Oral  PainSc:                  Linton Rump

## 2023-03-04 ENCOUNTER — Encounter (HOSPITAL_COMMUNITY): Payer: Self-pay | Admitting: Urology

## 2023-03-04 DIAGNOSIS — Z79899 Other long term (current) drug therapy: Secondary | ICD-10-CM | POA: Diagnosis not present

## 2023-03-04 DIAGNOSIS — I10 Essential (primary) hypertension: Secondary | ICD-10-CM | POA: Diagnosis not present

## 2023-03-04 DIAGNOSIS — J45909 Unspecified asthma, uncomplicated: Secondary | ICD-10-CM | POA: Diagnosis not present

## 2023-03-04 DIAGNOSIS — D360 Benign neoplasm of lymph nodes: Secondary | ICD-10-CM | POA: Diagnosis not present

## 2023-03-04 DIAGNOSIS — C61 Malignant neoplasm of prostate: Secondary | ICD-10-CM | POA: Diagnosis not present

## 2023-03-04 LAB — HEMOGLOBIN AND HEMATOCRIT, BLOOD
HCT: 35.4 % — ABNORMAL LOW (ref 39.0–52.0)
HCT: 41.2 % (ref 39.0–52.0)
Hemoglobin: 11.8 g/dL — ABNORMAL LOW (ref 13.0–17.0)
Hemoglobin: 14.3 g/dL (ref 13.0–17.0)

## 2023-03-04 MED ORDER — HYDROCODONE-ACETAMINOPHEN 5-325 MG PO TABS: 1.0000 | ORAL_TABLET | ORAL | Status: DC | PRN

## 2023-03-04 MED ORDER — RIVAROXABAN 2.5 MG PO TABS: 2.5000 mg | ORAL_TABLET | Freq: Two times a day (BID) | ORAL | 0 refills | Status: DC

## 2023-03-04 MED ORDER — BISACODYL 10 MG RE SUPP
10.0000 mg | Freq: Once | RECTAL | Status: AC
  Administered 2023-03-04: 10 mg via RECTAL
  Filled 2023-03-04: qty 1

## 2023-03-04 NOTE — Plan of Care (Signed)
  Problem: Education: Goal: Knowledge of the procedure and recovery process will improve Outcome: Progressing   Problem: Bowel/Gastric: Goal: Gastrointestinal status for postoperative course will improve Outcome: Progressing   Problem: Pain Management: Goal: General experience of comfort will improve Outcome: Progressing   Problem: Skin Integrity: Goal: Demonstration of wound healing without infection will improve Outcome: Progressing   Problem: Urinary Elimination: Goal: Ability to avoid or minimize complications of infection will improve Outcome: Progressing Goal: Ability to achieve and maintain urine output will improve Outcome: Progressing

## 2023-03-04 NOTE — Progress Notes (Signed)
Urology Progress Note   1 Day Post-Op s/p RALP with bilateral PLND 03/03/23  Subjective: No acute events overnight. Afebrile and stable. Tolerating clears. Has walked twice since surgery. Making adequate urine, 130cc from JP. Hgb 11.8 this morning.  Objective: Vital signs in last 24 hours: Temp:  [97.5 F (36.4 C)-98.5 F (36.9 C)] 97.5 F (36.4 C) (04/16 0352) Pulse Rate:  [74-102] 74 (04/16 0352) Resp:  [10-19] 17 (04/16 0352) BP: (117-176)/(68-93) 119/68 (04/16 0352) SpO2:  [92 %-99 %] 97 % (04/16 0352)  Intake/Output from previous day: 04/15 0701 - 04/16 0700 In: 3319.2 [P.O.:582; I.V.:2637.2; IV Piggyback:100] Out: 3055 [Urine:2750; Drains:130; Blood:175] Intake/Output this shift: No intake/output data recorded.  Physical Exam:  General: Alert and oriented CV: Regular rate Lungs: Normal work of breathing on room air Abdomen: Soft, appropriately tender, nondistended GU: Foley in place draining clear yellow urine  Ext: NT, No erythema  Lab Results: Recent Labs    03/03/23 1135 03/04/23 0517  HGB 16.0 11.8*  HCT 45.2 35.4*   BMET No results for input(s): "NA", "K", "CL", "CO2", "GLUCOSE", "BUN", "CREATININE", "CALCIUM" in the last 72 hours.   Studies/Results: No results found.  Assessment/Plan:  67 y.o. male s/p RALP and bilateral PLND on 03/03/23. Recovering well    - Transition to PO pain medications - OOB, ambulation, IS - Clear liquid diet - Medlock - Suppository - Repeat H/H at 1100  - Discontinue JP drain prior to discharge - Continue foley catheter s/p urologic procedure   Dispo: Floor status. Hopeful discharge today.   LOS: 0 days   Liela Rylee Lelani Garnett 03/04/2023, 7:21 AM

## 2023-03-04 NOTE — Discharge Summary (Signed)
  Date of admission: 03/03/2023  Date of discharge: 03/04/2023  Admission diagnosis: Prostate Cancer  Discharge diagnosis: Prostate Cancer  History and Physical: For full details, please see admission history and physical. Briefly, Stuart Gaines is a 67 y.o. gentleman with localized prostate cancer.  After discussing management/treatment options, he elected to proceed with surgical treatment.  Hospital Course: HALVOR RILLEY was taken to the operating room on 03/03/2023 and underwent a robotic assisted laparoscopic radical prostatectomy. He tolerated this procedure well and without complications. Postoperatively, he was able to be transferred to a regular hospital room following recovery from anesthesia.  He was able to begin ambulating the night of surgery. He remained hemodynamically stable overnight.  He had excellent urine output with appropriately minimal output from his pelvic drain and his pelvic drain was removed on POD #1.  He was transitioned to oral pain medication, tolerated a clear liquid diet, and had met all discharge criteria and was able to be discharged home later on POD#1.  Laboratory values:  Recent Labs    03/03/23 1135 03/04/23 0517 03/04/23 1027  HGB 16.0 11.8* 14.3  HCT 45.2 35.4* 41.2    Disposition: Home  Discharge instruction: He was instructed to be ambulatory but to refrain from heavy lifting, strenuous activity, or driving. He was instructed on urethral catheter care.  Discharge medications:   Allergies as of 03/04/2023       Reactions   Duloxetine Hcl    hallucinations   Lisinopril     blurred vision and feeling light-headed at high dosages    Oxycodone Hcl    anger/tachycardia   Pregabalin    hallucinations   Tramadol Hcl    did not help   Ampicillin Anxiety        Medication List     STOP taking these medications    Multivitamin Gummies Adult Chew       TAKE these medications    amLODipine 5 MG tablet Commonly known as:  NORVASC Take 5 mg by mouth every morning.   atorvastatin 40 MG tablet Commonly known as: LIPITOR Take 40 mg by mouth at bedtime.   docusate sodium 100 MG capsule Commonly known as: COLACE Take 1 capsule (100 mg total) by mouth 2 (two) times daily.   HYDROcodone-acetaminophen 7.5-325 MG tablet Commonly known as: NORCO Take 0.5 tablets by mouth every 6 (six) hours as needed for moderate pain. What changed:  when to take this reasons to take this   lisinopril 10 MG tablet Commonly known as: ZESTRIL Take 10 mg by mouth daily.   omeprazole 20 MG capsule Commonly known as: PRILOSEC Take 20 mg by mouth 2 (two) times daily.   rivaroxaban 2.5 MG Tabs tablet Commonly known as: XARELTO Take 1 tablet (2.5 mg total) by mouth 2 (two) times daily. What changed:  medication strength how much to take when to take this   sulfamethoxazole-trimethoprim 800-160 MG tablet Commonly known as: BACTRIM DS Take 1 tablet by mouth 2 (two) times daily. Start the day prior to foley removal appointment   Ventolin HFA 108 (90 Base) MCG/ACT inhaler Generic drug: albuterol Inhale 1-2 puffs into the lungs every 4 (four) hours as needed for shortness of breath.        Followup: He will followup in 1 week for catheter removal and to discuss his surgical pathology results.

## 2023-03-07 LAB — SURGICAL PATHOLOGY

## 2023-03-24 DIAGNOSIS — S92314A Nondisplaced fracture of first metatarsal bone, right foot, initial encounter for closed fracture: Secondary | ICD-10-CM | POA: Diagnosis not present

## 2023-03-25 DIAGNOSIS — J4520 Mild intermittent asthma, uncomplicated: Secondary | ICD-10-CM | POA: Diagnosis not present

## 2023-03-25 DIAGNOSIS — I10 Essential (primary) hypertension: Secondary | ICD-10-CM | POA: Diagnosis not present

## 2023-03-25 DIAGNOSIS — Z9181 History of falling: Secondary | ICD-10-CM | POA: Diagnosis not present

## 2023-03-25 DIAGNOSIS — E7800 Pure hypercholesterolemia, unspecified: Secondary | ICD-10-CM | POA: Diagnosis not present

## 2023-03-25 DIAGNOSIS — M549 Dorsalgia, unspecified: Secondary | ICD-10-CM | POA: Diagnosis not present

## 2023-03-25 DIAGNOSIS — Z0000 Encounter for general adult medical examination without abnormal findings: Secondary | ICD-10-CM | POA: Diagnosis not present

## 2023-03-25 DIAGNOSIS — K219 Gastro-esophageal reflux disease without esophagitis: Secondary | ICD-10-CM | POA: Diagnosis not present

## 2023-03-25 DIAGNOSIS — G894 Chronic pain syndrome: Secondary | ICD-10-CM | POA: Diagnosis not present

## 2023-03-31 DIAGNOSIS — M6281 Muscle weakness (generalized): Secondary | ICD-10-CM | POA: Diagnosis not present

## 2023-03-31 DIAGNOSIS — M62838 Other muscle spasm: Secondary | ICD-10-CM | POA: Diagnosis not present

## 2023-04-15 DIAGNOSIS — M62838 Other muscle spasm: Secondary | ICD-10-CM | POA: Diagnosis not present

## 2023-04-15 DIAGNOSIS — M6281 Muscle weakness (generalized): Secondary | ICD-10-CM | POA: Diagnosis not present

## 2023-05-05 DIAGNOSIS — M19071 Primary osteoarthritis, right ankle and foot: Secondary | ICD-10-CM | POA: Diagnosis not present

## 2023-05-05 DIAGNOSIS — M6701 Short Achilles tendon (acquired), right ankle: Secondary | ICD-10-CM | POA: Diagnosis not present

## 2023-05-23 DIAGNOSIS — R8271 Bacteriuria: Secondary | ICD-10-CM | POA: Diagnosis not present

## 2023-05-28 DIAGNOSIS — M62838 Other muscle spasm: Secondary | ICD-10-CM | POA: Diagnosis not present

## 2023-05-28 DIAGNOSIS — M6281 Muscle weakness (generalized): Secondary | ICD-10-CM | POA: Diagnosis not present

## 2023-05-29 ENCOUNTER — Other Ambulatory Visit: Payer: Self-pay

## 2023-05-29 ENCOUNTER — Inpatient Hospital Stay: Payer: Medicare Other | Admitting: Hematology and Oncology

## 2023-05-29 ENCOUNTER — Inpatient Hospital Stay: Payer: Medicare Other | Attending: Hematology and Oncology

## 2023-05-29 ENCOUNTER — Other Ambulatory Visit: Payer: Self-pay | Admitting: Hematology and Oncology

## 2023-05-29 LAB — RETIC PANEL
Immature Retic Fract: 9.4 % (ref 2.3–15.9)
RBC.: 4.73 MIL/uL (ref 4.22–5.81)
Retic Count, Absolute: 61.5 10*3/uL (ref 19.0–186.0)
Retic Ct Pct: 1.3 % (ref 0.4–3.1)
Reticulocyte Hemoglobin: 35.4 pg (ref >27.9)

## 2023-05-29 LAB — CBC WITH DIFFERENTIAL (CANCER CENTER ONLY)
Abs Immature Granulocytes: 0.02 10*3/uL (ref 0.00–0.07)
Basophils Absolute: 0.1 10*3/uL (ref 0.0–0.1)
Basophils Relative: 1 %
Eosinophils Absolute: 0.1 10*3/uL (ref 0.0–0.5)
Eosinophils Relative: 1 %
HCT: 42.4 % (ref 39.0–52.0)
Hemoglobin: 15.5 g/dL (ref 13.0–17.0)
Immature Granulocytes: 0 %
Lymphocytes Relative: 45 %
Lymphs Abs: 2.9 10*3/uL (ref 0.7–4.0)
MCH: 33.1 pg (ref 26.0–34.0)
MCHC: 36.6 g/dL — ABNORMAL HIGH (ref 30.0–36.0)
MCV: 90.6 fL (ref 80.0–100.0)
Monocytes Absolute: 0.4 10*3/uL (ref 0.1–1.0)
Monocytes Relative: 6 %
Neutro Abs: 3 10*3/uL (ref 1.7–7.7)
Neutrophils Relative %: 47 %
Platelet Count: 243 10*3/uL (ref 150–400)
RBC: 4.68 MIL/uL (ref 4.22–5.81)
RDW: 12.4 % (ref 11.5–15.5)
WBC Count: 6.4 10*3/uL (ref 4.0–10.5)
nRBC: 0 % (ref 0.0–0.2)

## 2023-05-29 LAB — CMP (CANCER CENTER ONLY)
ALT: 44 U/L (ref 0–44)
AST: 39 U/L (ref 15–41)
Albumin: 3.9 g/dL (ref 3.5–5.0)
Alkaline Phosphatase: 110 U/L (ref 38–126)
Anion gap: 8 (ref 5–15)
BUN: 12 mg/dL (ref 8–23)
CO2: 22 mmol/L (ref 22–32)
Calcium: 9.3 mg/dL (ref 8.9–10.3)
Chloride: 108 mmol/L (ref 98–111)
Creatinine: 1.02 mg/dL (ref 0.61–1.24)
GFR, Estimated: >60 mL/min (ref >60)
Glucose, Bld: 107 mg/dL — ABNORMAL HIGH (ref 70–99)
Potassium: 4.2 mmol/L (ref 3.5–5.1)
Sodium: 138 mmol/L (ref 135–145)
Total Bilirubin: 0.6 mg/dL (ref 0.3–1.2)
Total Protein: 7.2 g/dL (ref 6.5–8.1)

## 2023-05-29 LAB — IRON AND IRON BINDING CAPACITY (CC-WL,HP ONLY)
Iron: 232 ug/dL — ABNORMAL HIGH (ref 45–182)
Saturation Ratios: 95 % — ABNORMAL HIGH (ref 17.9–39.5)
TIBC: 244 ug/dL — ABNORMAL LOW (ref 250–450)
UIBC: 12 ug/dL — ABNORMAL LOW (ref 117–376)

## 2023-05-29 LAB — FERRITIN: Ferritin: 197 ng/mL (ref 24–336)

## 2023-05-30 ENCOUNTER — Telehealth: Payer: Self-pay | Admitting: *Deleted

## 2023-05-30 ENCOUNTER — Telehealth: Payer: Self-pay | Admitting: Hematology and Oncology

## 2023-05-30 NOTE — Telephone Encounter (Signed)
TCT patient regarding lab results from yesterday. Spoke with him Advised that his ferritin 197, above our target of 150. We will need to start phlebotomies q 2 weeks to drop his ferritin <50. Scheduling request has been placed.  Advised that he is no longer anemic, that his HGBhas recovered from his surgery in April and that it was safe to do these phlebotomies. Pt voiced understanding.  Scheduling made aware to call pt to schedule these appts.

## 2023-05-30 NOTE — Telephone Encounter (Signed)
-----   Message from Stuart Gaines sent at 05/30/2023  9:39 AM EDT ----- Please let [name] his ferritin 197, above our target of 150. We will need to start bleeding him q 2 weeks to drop his ferritin <50. Scheduling request has been placed. ----- Message ----- From: Stuart Gaines, Lab In [place] Sent: 05/29/2023  10:16 AM EDT To: [name], MD

## 2023-05-30 NOTE — Progress Notes (Signed)
[facility] [phone]   [phone]  PROGRESS NOTE  Patient Care Team: Augustin Schooling, MD as PCP - General Cherlyn Cushing, RN as Oncology Nurse Navigator  Hematological/Oncological History # Hereditary Hemachromatosis (Homozygous C282Y) 1) 09/13/2019: Transferrin 189, Ferritin 117.4. PCP referred to hematology for further evaluation and management.  2) 10/05/2019: establish care with Dr. Leonides Schanz  3) 10/26/2019: Phlebotomy #1 4) 12/10/2019: Phlebotomy #2. Ferritin 249 (12/06/19)  5) 12/23/2019: Phlebotomy #3. Ferritin 145 6) 01/14/2020: Phlebotomy #4. Ferritin 117 7) 01/27/2020: Phlebotomy #5. Ferritin 88 8) 02/10/2020: Phlebotomy #6 and f/u visit. Ferritin 36.  9) 03/09/2020: Phlebotomy #7. Ferritin 22, held further phlebotomy.  10) 02/02/2021: Phlebotomy #8. Ferritin 56 11) 02/16/2021:Phlebotomy #9. Ferritin 52 12) 03/02/2021: Phlebotomy #10. Ferritin 25, held further phlebotomy.  Interval History:  Stuart Gaines 67 y.o. male with medical history significant for hereditary hemachromatosis presents for a follow up visit. The patient's last visit was on 11/28/2022. In the interim since the last visit he went a prostatectomy approximately 2 months ago.  On exam today today Mr. Clemenson reports he has been out of the boot he had previously been wearing on his leg.  He reports that he had it removed in December and was also off crutches at that time as well.  He notes he is currently following with Dr. Annabell Howells for his PSA levels after having undergone a prostatectomy 2 months ago.  He reports that he did have issues with a bladder infection and recently was taking trimethoprim sulfa.  He reports that his urine went from orange juice color to a light yellow urine.  He notes he has not been having any other issues with fevers, chills, sweats, nausea, vomiting or diarrhea.  He is had no other overt signs of bleeding.  A full 10 point ROS is otherwise negative.  MEDICAL HISTORY:   Past Medical History:  Diagnosis Date   Arthritis    Asthma    seasonal   Blood dyscrasia    hemachromatosis   Cancer (HCC)    prostate   Chronic back pain    Complication of anesthesia    very agitated and combative on both back surgeries   High cholesterol    Hypertension     SURGICAL HISTORY: Past Surgical History:  Procedure Laterality Date   BACK SURGERY     1990 and 1999  by Dr. Jeral Fruit   CHOLECYSTECTOMY     FINGER SURGERY Right    right thumb cyst removal   FOOT ARTHRODESIS Right 01/02/2023   Procedure: Right First, Second and Third Tarsal Metatarsal Joint Arthrodesis;  Surgeon: Toni Arthurs, MD;  Location: [facility];  Service: Orthopedics;  Laterality: Right;   FOOT FRACTURE SURGERY Right    FRACTURE SURGERY Right 2015   ORIF   GASTROCNEMIUS RECESSION Right 01/02/2023   Procedure: Gastrocnemius Recession;  Surgeon: Toni Arthurs, MD;  Location: [facility];  Service: Orthopedics;  Laterality: Right;   HEMORROIDECTOMY  2006   LYMPHADENECTOMY Bilateral 03/03/2023   Procedure: BILATERAL PELVIC LYMPHADENECTOMY;  Surgeon: Heloise Purpura, MD;  Location: [facility];  Service: Urology;  Laterality: Bilateral;   right knee surgery Bilateral    meniscus   ROBOT ASSISTED LAPAROSCOPIC RADICAL PROSTATECTOMY N/A 03/03/2023   Procedure: XI ROBOTIC ASSISTED LAPAROSCOPIC RADICAL PROSTATECTOMY LEVEL 2;  Surgeon: Heloise Purpura, MD;  Location: [facility];  Service: Urology;  Laterality: N/A;  210 MINUTES NEEDED FOR CASE   WRIST SURGERY Left    ganglion removed  SOCIAL HISTORY: Social History   Socioeconomic History   Marital status: Divorced    Spouse name: Not on file   Number of children: 2   Years of education: Not on file   Highest education level: Not on file  Occupational History   Not on file  Tobacco Use   Smoking status: Former    Current packs/day: 0.00    Average packs/day: 1 pack/day for 0.5 years (0.5 ttl pk-yrs)    Types: Cigarettes     Start date: 05/1973    Quit date: 40    Years since quitting: 49.5   Smokeless tobacco: Not on file   Tobacco comments:    Smoked for 3 months only  Vaping Use   Vaping status: Never Used  Substance and Sexual Activity   Alcohol use: No   Drug use: Never   Sexual activity: Not Currently  Other Topics Concern   Not on file  Social History Narrative   Not on file   Social Determinants of Health   Financial Resource Strain: Not on file  Food Insecurity: No Food Insecurity (03/03/2023)   Hunger Vital Sign    Worried About Running Out of Food in the Last Year: Never true    Ran Out of Food in the Last Year: Never true  Transportation Needs: Unmet Transportation Needs (03/03/2023)   PRAPARE - Administrator, Civil Service (Medical): Yes    Lack of Transportation (Non-Medical): Yes  Physical Activity: Not on file  Stress: Not on file  Social Connections: Not on file  Intimate Partner Violence: Not At Risk (03/03/2023)   Humiliation, Afraid, Rape, and Kick questionnaire    Fear of Current or Ex-Partner: No    Emotionally Abused: No    Physically Abused: No    Sexually Abused: No    FAMILY HISTORY: Family History  Problem Relation Age of Onset   Anemia Mother    Stroke Father    Anemia Father    Throat cancer Father    Diabetes Brother    Ovarian cancer Paternal Aunt    Prostate cancer Paternal Uncle    Pancreatic cancer Paternal Uncle    Cancer Maternal Grandfather    Stroke Paternal Grandmother     ALLERGIES:  is allergic to duloxetine hcl, lisinopril, oxycodone hcl, pregabalin, tramadol hcl, and ampicillin.  MEDICATIONS:  Current Outpatient Medications  Medication Sig Dispense Refill   polyethylene glycol (MIRALAX / GLYCOLAX) 17 g packet Take 17 g by mouth daily.     amLODipine (NORVASC) 5 MG tablet Take 5 mg by mouth every morning.     atorvastatin (LIPITOR) 40 MG tablet Take 40 mg by mouth at bedtime.     HYDROcodone-acetaminophen (NORCO) 7.5-325  MG tablet Take 0.5 tablets by mouth every 6 (six) hours as needed for moderate pain. 10 tablet 0   lisinopril (ZESTRIL) 10 MG tablet Take 10 mg by mouth daily.     omeprazole (PRILOSEC) 20 MG capsule Take 20 mg by mouth 2 (two) times daily.     sulfamethoxazole-trimethoprim (BACTRIM DS) 800-160 MG tablet Take 1 tablet by mouth 2 (two) times daily. Start the day prior to foley removal appointment 6 tablet 0   VENTOLIN HFA 108 (90 BASE) MCG/ACT inhaler Inhale 1-2 puffs into the lungs every 4 (four) hours as needed for shortness of breath.     No current facility-administered medications for this visit.    REVIEW OF SYSTEMS:   Constitutional: ( - ) fevers, ( - )  chills , ( - ) night sweats Eyes: ( - ) blurriness of vision, ( - ) double vision, ( - ) watery eyes Ears, nose, mouth, throat, and face: ( - ) mucositis, ( - ) sore throat Respiratory: ( - ) cough, ( - ) dyspnea, ( - ) wheezes Cardiovascular: ( - ) palpitation, ( - ) chest discomfort, ( - ) lower extremity swelling Gastrointestinal:  ( - ) nausea, ( - ) heartburn, ( - ) change in bowel habits Skin: ( - ) abnormal skin rashes Lymphatics: ( - ) new lymphadenopathy, ( - ) easy bruising Neurological: ( - ) numbness, ( - ) tingling, ( - ) new weaknesses Behavioral/Psych: ( - ) mood change, ( - ) new changes  All other systems were reviewed with the patient and are negative.  PHYSICAL EXAMINATION: ECOG PERFORMANCE STATUS: 1 - Symptomatic but completely ambulatory  Vitals:   05/29/23 1026  BP: (!) 145/86  Pulse: 85  Resp: 16  Temp: 97.7 F (36.5 C)  SpO2: 96%   Filed Weights   05/29/23 1026  Weight: 227 lb 9.6 oz (103.2 kg)    GENERAL: well appearing elderly Caucasian male in no distress and comfortable SKIN: skin color, texture, turgor are normal, no rashes or significant lesions EYES: conjunctiva are pink and non-injected, sclera clear LUNGS: clear to auscultation and percussion with normal breathing effort HEART: regular  rate & rhythm and no murmurs and no lower extremity edema Musculoskeletal: no cyanosis of digits and no clubbing. Left foot no longer in large orthopedic boot.  PSYCH: alert & oriented x 3, fluent speech NEURO: no focal motor/sensory deficits  LABORATORY DATA:  I have reviewed the data as listed    Latest Ref Rng & Units 06/04/2023    9:34 AM 05/29/2023    9:58 AM 03/04/2023   10:27 AM  CBC  WBC 4.0 - 10.5 K/uL 6.4  6.4    Hemoglobin 13.0 - 17.0 g/dL 91.4  78.2  95.6   Hematocrit 39.0 - 52.0 % 43.5  42.4  41.2   Platelets 150 - 400 K/uL 305  243         Latest Ref Rng & Units 05/29/2023    9:58 AM 02/27/2023    9:34 AM 02/18/2023   10:00 AM  CMP  Glucose 70 - 99 mg/dL 213  086  578   BUN 8 - 23 mg/dL 12  13  13    Creatinine 0.61 - 1.24 mg/dL 4.69  6.29  5.28   Sodium 135 - 145 mmol/L 138  138  136   Potassium 3.5 - 5.1 mmol/L 4.2  3.9  4.0   Chloride 98 - 111 mmol/L 108  105  104   CO2 22 - 32 mmol/L 22  26  23    Calcium 8.9 - 10.3 mg/dL 9.3  9.6  9.2   Total Protein 6.5 - 8.1 g/dL 7.2  7.3    Total Bilirubin 0.3 - 1.2 mg/dL 0.6  0.8    Alkaline Phos 38 - 126 U/L 110  134    AST 15 - 41 U/L 39  23    ALT 0 - 44 U/L 44  31     Iron/TIBC/Ferritin/ %Sat    Component Value Date/Time   IRON 232 (H) 05/29/2023 0958   TIBC 244 (L) 05/29/2023 0958   FERRITIN 107 06/04/2023 0934   IRONPCTSAT 95 (H) 05/29/2023 0958     RADIOGRAPHIC STUDIES: None to review.  ASSESSMENT & PLAN Gwen  Sherilyn Banker 67 y.o. male with medical history significant for hereditary hemachromatosis presents for a follow up visit.    Review of the labs and discussion with the patient his findings are most consistent with a hereditary hemochromatosis secondary to a homozygous C282Y mutation.  As such I do think it would be reasonable to continue moving forward with routine phlebotomies in order to decrease his ferritin level.  We did set an aggressive ferritin goal of ferritin less than 30 (after discussion we  decided to take it from 50 down further to 30), but on 11/28/2021 the patient noted he would be agreeable to the standard threshold of 50.  We will try to assure that we do not rapidly drop his hemoglobin to cause him to have any symptoms of fatigue, lightheadedness or dizziness.  On exam today Mr. Porter is at his baseline level of health.  His ferritin was at goal at last check 3 months ago, rechecking levels today.  He established care with Dr. Barbaraann Cao who is helping to manage his headaches which is thought to be secondary to an excess CSF.  We'll plan to see Mr. Looney back in 6 months time unless he requires further phlebotomy.   #Hereditary Hemachromatosis (Homozygous C282Y) --repeat CBC and ferritin levels today --plan to restart phlebotomy q 2 weeks if ferritin is found to be >150. Patient has completed 9 phlebotomies and has been on hold since reaching goal on 03/02/2021.  -- Echocardiogram on 12/15/2019 shows good baseline cardiac function PLAN: --ferritin 197 and iron sat 95% today.  Labs show hemoglobin 15.5, WBC 6.4, MCV 90.6, Plt 243.  -- RTC in 3 months with interval phlebotomies q 2 weeks until Ferritin is <50   # Localized Adenocarcinoma of the Prostate s/p Prostatectomy on 03/03/2023 -- We will be available if there is anything we do to help manage the results of his prostate biopsy. -- O[facility]li [facility] status post prostatectomy.  # Headaches - evaluated by neurology, continue care with Dr. Barbaraann Cao as needed  --headaches improved, occur infrequently  No orders of the defined types were placed in this encounter.   All questions were answered. The patient knows to call the clinic with any problems, questions or concerns.  A total of more than 30 minutes were spent on this encounter and over half of that time was spent on counseling and coordination of care as outlined above.   Ulysees Barns, MD Department of Hematology/Oncology [facility] Phone: [phone] Pager: [phone] Email: [email or web address]  06/07/2023 3:06 PM

## 2023-06-04 ENCOUNTER — Inpatient Hospital Stay: Payer: Medicare Other

## 2023-06-04 ENCOUNTER — Other Ambulatory Visit: Payer: Self-pay

## 2023-06-04 ENCOUNTER — Other Ambulatory Visit: Payer: Self-pay | Admitting: *Deleted

## 2023-06-04 LAB — CBC WITH DIFFERENTIAL (CANCER CENTER ONLY)
Abs Immature Granulocytes: 0.02 10*3/uL (ref 0.00–0.07)
Basophils Absolute: 0.1 10*3/uL (ref 0.0–0.1)
Basophils Relative: 1 %
Eosinophils Absolute: 0.1 10*3/uL (ref 0.0–0.5)
Eosinophils Relative: 1 %
HCT: 43.5 % (ref 39.0–52.0)
Hemoglobin: 15.8 g/dL (ref 13.0–17.0)
Immature Granulocytes: 0 %
Lymphocytes Relative: 45 %
Lymphs Abs: 2.9 10*3/uL (ref 0.7–4.0)
MCH: 32.8 pg (ref 26.0–34.0)
MCHC: 36.3 g/dL — ABNORMAL HIGH (ref 30.0–36.0)
MCV: 90.4 fL (ref 80.0–100.0)
Monocytes Absolute: 0.4 10*3/uL (ref 0.1–1.0)
Monocytes Relative: 6 %
Neutro Abs: 3 10*3/uL (ref 1.7–7.7)
Neutrophils Relative %: 47 %
Platelet Count: 305 10*3/uL (ref 150–400)
RBC: 4.81 MIL/uL (ref 4.22–5.81)
RDW: 12.1 % (ref 11.5–15.5)
WBC Count: 6.4 10*3/uL (ref 4.0–10.5)
nRBC: 0 % (ref 0.0–0.2)

## 2023-06-04 LAB — FERRITIN: Ferritin: 107 ng/mL (ref 24–336)

## 2023-06-04 NOTE — Progress Notes (Signed)
Pt visited today for a phlebotomy appt due to ferritin levels >150. Phlebotomy started at 1011 with an output of 582g and ended at 1016. Pt stayed for 30 minute post observation and offered snacks and a drink. Pt tolerated phlebotomy well and VSS on d/c. Ambulatory to lobby with no distress.

## 2023-06-07 ENCOUNTER — Encounter: Payer: Self-pay | Admitting: Hematology and Oncology

## 2023-06-12 DIAGNOSIS — C61 Malignant neoplasm of prostate: Secondary | ICD-10-CM

## 2023-06-12 NOTE — Progress Notes (Signed)
Patient was presented at the [facility] on 12/10/22 for his stage T1c adenocarcinoma of the prostate with a Gleason's score of 4+3 and a PSA of 8.04.  Patient proceed with treatment recommendations of unilateral left nerve sparing robot-assisted laparoscopic radical prostatectomy and bilateral pelvic lymphadenectomy and had this on 03/03/23. Patient has received his first post operative PSA on 06/03/2023, undetectable.  Patient remains under active follow up's with urology at this time.

## 2023-06-18 ENCOUNTER — Other Ambulatory Visit: Payer: Self-pay

## 2023-06-18 ENCOUNTER — Inpatient Hospital Stay: Payer: Medicare Other

## 2023-06-18 LAB — CBC WITH DIFFERENTIAL (CANCER CENTER ONLY)
Abs Immature Granulocytes: 0.02 10*3/uL (ref 0.00–0.07)
Basophils Absolute: 0 10*3/uL (ref 0.0–0.1)
Basophils Relative: 1 %
Eosinophils Absolute: 0.1 10*3/uL (ref 0.0–0.5)
Eosinophils Relative: 1 %
HCT: 42.5 % (ref 39.0–52.0)
Hemoglobin: 15.6 g/dL (ref 13.0–17.0)
Immature Granulocytes: 0 %
Lymphocytes Relative: 40 %
Lymphs Abs: 2.9 10*3/uL (ref 0.7–4.0)
MCH: 33.3 pg (ref 26.0–34.0)
MCHC: 36.7 g/dL — ABNORMAL HIGH (ref 30.0–36.0)
MCV: 90.6 fL (ref 80.0–100.0)
Monocytes Absolute: 0.5 10*3/uL (ref 0.1–1.0)
Monocytes Relative: 6 %
Neutro Abs: 3.9 10*3/uL (ref 1.7–7.7)
Neutrophils Relative %: 52 %
Platelet Count: 204 10*3/uL (ref 150–400)
RBC: 4.69 MIL/uL (ref 4.22–5.81)
RDW: 13.1 % (ref 11.5–15.5)
WBC Count: 7.4 10*3/uL (ref 4.0–10.5)
nRBC: 0 % (ref 0.0–0.2)

## 2023-06-18 LAB — CMP (CANCER CENTER ONLY)
ALT: 30 U/L (ref 0–44)
AST: 22 U/L (ref 15–41)
Albumin: 4.3 g/dL (ref 3.5–5.0)
Alkaline Phosphatase: 114 U/L (ref 38–126)
Anion gap: 6 (ref 5–15)
BUN: 9 mg/dL (ref 8–23)
CO2: 26 mmol/L (ref 22–32)
Calcium: 9.4 mg/dL (ref 8.9–10.3)
Chloride: 108 mmol/L (ref 98–111)
Creatinine: 0.94 mg/dL (ref 0.61–1.24)
GFR, Estimated: >60 mL/min (ref >60)
Glucose, Bld: 130 mg/dL — ABNORMAL HIGH (ref 70–99)
Potassium: 3.8 mmol/L (ref 3.5–5.1)
Sodium: 140 mmol/L (ref 135–145)
Total Bilirubin: 0.9 mg/dL (ref 0.3–1.2)
Total Protein: 7.1 g/dL (ref 6.5–8.1)

## 2023-06-18 LAB — FERRITIN: Ferritin: 35 ng/mL (ref 24–336)

## 2023-06-18 NOTE — Progress Notes (Signed)
Therapeutic phlebotomy performed per MD orders. 16G kit utilized on RAC, started at 1036 and ended at 1039 with 534 grams removed. Pt tolerated well, fluids and food offered. Monitored for 30 minutes without incident. VSS, ambulatory to the lobby.

## 2023-06-18 NOTE — Patient Instructions (Signed)

## 2023-06-23 ENCOUNTER — Inpatient Hospital Stay: Payer: Medicare Other | Admitting: *Deleted

## 2023-06-23 ENCOUNTER — Telehealth: Payer: Self-pay | Admitting: *Deleted

## 2023-06-24 ENCOUNTER — Inpatient Hospital Stay: Payer: Medicare Other | Admitting: *Deleted

## 2023-06-25 ENCOUNTER — Inpatient Hospital Stay: Payer: Medicare Other | Attending: Adult Health | Admitting: *Deleted

## 2023-06-25 DIAGNOSIS — C61 Malignant neoplasm of prostate: Secondary | ICD-10-CM

## 2023-06-26 ENCOUNTER — Telehealth: Payer: Self-pay | Admitting: *Deleted

## 2023-06-26 ENCOUNTER — Encounter: Payer: Self-pay | Admitting: *Deleted

## 2023-06-26 ENCOUNTER — Encounter: Payer: Self-pay | Admitting: Hematology and Oncology

## 2023-06-26 NOTE — Telephone Encounter (Signed)
-----   Message from Ulysees Barns IV sent at 06/26/2023  8:54 AM EDT ----- Please let Mr. Nicolich know that his ferritin is at 35, currently on target (<50). We will cancel his labs/phlebotomies and plan to see him in Sept 2024 to reassess. ----- Message ----- From: Leory Plowman, [facility] [place] Sent: 06/18/2023  10:12 AM EDT To: [name], MD

## 2023-06-26 NOTE — Progress Notes (Signed)
SCP reviewed and completed. 

## 2023-06-26 NOTE — Telephone Encounter (Signed)
TCT patient regarding recent lab results. Spoke with him. Advised  that his ferritin is at 35, currently on target (<50). We will cancel his labs/phlebotomies and plan to see him in Sept 2024 to reassess. Pt voiced understanding. He is glad to not any more phlebotomies for now.  He is aware of his appt in September 2024

## 2023-07-01 DIAGNOSIS — M62838 Other muscle spasm: Secondary | ICD-10-CM | POA: Diagnosis not present

## 2023-07-01 DIAGNOSIS — M6281 Muscle weakness (generalized): Secondary | ICD-10-CM | POA: Diagnosis not present

## 2023-07-03 ENCOUNTER — Inpatient Hospital Stay: Payer: Medicare Other | Attending: Hematology and Oncology

## 2023-07-03 ENCOUNTER — Inpatient Hospital Stay: Payer: Medicare Other

## 2023-07-09 DIAGNOSIS — M6701 Short Achilles tendon (acquired), right ankle: Secondary | ICD-10-CM | POA: Diagnosis not present

## 2023-07-09 DIAGNOSIS — M19071 Primary osteoarthritis, right ankle and foot: Secondary | ICD-10-CM | POA: Diagnosis not present

## 2023-07-16 ENCOUNTER — Inpatient Hospital Stay: Payer: Medicare Other

## 2023-07-31 ENCOUNTER — Inpatient Hospital Stay: Payer: Medicare Other

## 2023-08-06 DIAGNOSIS — M6281 Muscle weakness (generalized): Secondary | ICD-10-CM | POA: Diagnosis not present

## 2023-08-06 DIAGNOSIS — M62838 Other muscle spasm: Secondary | ICD-10-CM | POA: Diagnosis not present

## 2023-08-12 NOTE — Progress Notes (Signed)
Per Dr Leonides Schanz, no phlebotomy needed with current ferritin result of 35ng/mL from 06/18/2023

## 2023-08-13 ENCOUNTER — Inpatient Hospital Stay: Payer: Medicare Other

## 2023-08-13 ENCOUNTER — Inpatient Hospital Stay: Payer: Medicare Other | Attending: Hematology and Oncology | Admitting: Hematology and Oncology

## 2023-08-13 VITALS — BP 133/82 | HR 76 | Temp 97.6°F | Resp 16 | Wt 232.5 lb

## 2023-08-13 DIAGNOSIS — C61 Malignant neoplasm of prostate: Secondary | ICD-10-CM

## 2023-08-13 LAB — CMP (CANCER CENTER ONLY)
ALT: 29 U/L (ref 0–44)
AST: 24 U/L (ref 15–41)
Albumin: 4.2 g/dL (ref 3.5–5.0)
Alkaline Phosphatase: 106 U/L (ref 38–126)
Anion gap: 7 (ref 5–15)
BUN: 12 mg/dL (ref 8–23)
CO2: 26 mmol/L (ref 22–32)
Calcium: 9.3 mg/dL (ref 8.9–10.3)
Chloride: 106 mmol/L (ref 98–111)
Creatinine: 1.04 mg/dL (ref 0.61–1.24)
GFR, Estimated: >60 mL/min (ref >60)
Glucose, Bld: 116 mg/dL — ABNORMAL HIGH (ref 70–99)
Potassium: 3.8 mmol/L (ref 3.5–5.1)
Sodium: 139 mmol/L (ref 135–145)
Total Bilirubin: 0.9 mg/dL (ref 0.3–1.2)
Total Protein: 7.1 g/dL (ref 6.5–8.1)

## 2023-08-13 LAB — CBC WITH DIFFERENTIAL (CANCER CENTER ONLY)
Abs Immature Granulocytes: 0.01 10*3/uL (ref 0.00–0.07)
Basophils Absolute: 0.1 10*3/uL (ref 0.0–0.1)
Basophils Relative: 1 %
Eosinophils Absolute: 0.1 10*3/uL (ref 0.0–0.5)
Eosinophils Relative: 1 %
HCT: 41.6 % (ref 39.0–52.0)
Hemoglobin: 15.1 g/dL (ref 13.0–17.0)
Immature Granulocytes: 0 %
Lymphocytes Relative: 49 %
Lymphs Abs: 3.1 10*3/uL (ref 0.7–4.0)
MCH: 32.8 pg (ref 26.0–34.0)
MCHC: 36.3 g/dL — ABNORMAL HIGH (ref 30.0–36.0)
MCV: 90.4 fL (ref 80.0–100.0)
Monocytes Absolute: 0.4 10*3/uL (ref 0.1–1.0)
Monocytes Relative: 7 %
Neutro Abs: 2.6 10*3/uL (ref 1.7–7.7)
Neutrophils Relative %: 42 %
Platelet Count: 217 10*3/uL (ref 150–400)
RBC: 4.6 MIL/uL (ref 4.22–5.81)
RDW: 12 % (ref 11.5–15.5)
WBC Count: 6.3 10*3/uL (ref 4.0–10.5)
nRBC: 0 % (ref 0.0–0.2)

## 2023-08-13 LAB — FERRITIN: Ferritin: 17 ng/mL — ABNORMAL LOW (ref 24–336)

## 2023-08-13 NOTE — Progress Notes (Unsigned)
[facility] Telephone:(336) [phone]   Fax:([phone]  PROGRESS NOTE  Patient Care Team: Augustin Schooling, MD as PCP - General Cherlyn Cushing, RN as Oncology Nurse Navigator Heloise Purpura, MD as Consulting Physician (Urology) Margaretmary Dys, MD as Consulting Physician (Radiation Oncology) Maryclare Labrador, RN as Registered Nurse  Hematological/Oncological History # Hereditary Hemachromatosis (Homozygous C282Y) 1) 09/13/2019: Transferrin 189, Ferritin 117.4. PCP referred to hematology for further evaluation and management.  2) 10/05/2019: establish care with Dr. Leonides Schanz  3) 10/26/2019: Phlebotomy #1 4) 12/10/2019: Phlebotomy #2. Ferritin 249 (12/06/19)  5) 12/23/2019: Phlebotomy #3. Ferritin 145 6) 01/14/2020: Phlebotomy #4. Ferritin 117 7) 01/27/2020: Phlebotomy #5. Ferritin 88 8) 02/10/2020: Phlebotomy #6 and f/u visit. Ferritin 36.  9) 03/09/2020: Phlebotomy #7. Ferritin 22, held further phlebotomy.  10) 02/02/2021: Phlebotomy #8. Ferritin 56 11) 02/16/2021:Phlebotomy #9. Ferritin 52 12) 03/02/2021: Phlebotomy #10. Ferritin 25, held further phlebotomy.  Interval History:  Stuart Gaines 66 y.o. male with medical history significant for hereditary hemachromatosis presents for a follow up visit. The patient's last visit was on 05/29/2023. In the interim since the last visit he has had no major changes in his health.  On exam today today Stuart Gaines reports he is undergone 2 phlebotomies in the interim since her last visit and hit target.  He reports he is eating regularly and not restricting his diet.  He notes that he reports that he has not been having any issues with lightheadedness, dizziness, shortness of breath.  He is been having no palpitations of the heart or headache/chest pain.  He notes that he is also not having any overt signs of bleeding, bruising, or dark stools.  He notes that overall he feels well and has no questions concerns or complaints today.  He notes he has not  been having any other issues with fevers, chills, sweats, nausea, vomiting or diarrhea.  He is had no other overt signs of bleeding.  A full 10 point ROS is otherwise negative.  MEDICAL HISTORY:  Past Medical History:  Diagnosis Date   Arthritis    Asthma    seasonal   Blood dyscrasia    hemachromatosis   Cancer (HCC)    prostate   Chronic back pain    Complication of anesthesia    very agitated and combative on both back surgeries   High cholesterol    Hypertension     SURGICAL HISTORY: Past Surgical History:  Procedure Laterality Date   BACK SURGERY     1990 and 1999  by Dr. Jeral Fruit   CHOLECYSTECTOMY     FINGER SURGERY Right    right thumb cyst removal   FOOT ARTHRODESIS Right 01/02/2023   Procedure: Right First, Second and Third Tarsal Metatarsal Joint Arthrodesis;  Surgeon: Toni Arthurs, MD;  Location: [facility];  Service: Orthopedics;  Laterality: Right;   FOOT FRACTURE SURGERY Right    FRACTURE SURGERY Right 2015   ORIF   GASTROCNEMIUS RECESSION Right 01/02/2023   Procedure: Gastrocnemius Recession;  Surgeon: Toni Arthurs, MD;  Location: [facility];  Service: Orthopedics;  Laterality: Right;   HEMORROIDECTOMY  2006   LYMPHADENECTOMY Bilateral 03/03/2023   Procedure: BILATERAL PELVIC LYMPHADENECTOMY;  Surgeon: Heloise Purpura, MD;  Location: [facility];  Service: Urology;  Laterality: Bilateral;   right knee surgery Bilateral    meniscus   ROBOT ASSISTED LAPAROSCOPIC RADICAL PROSTATECTOMY N/A 03/03/2023   Procedure: XI ROBOTIC ASSISTED LAPAROSCOPIC RADICAL PROSTATECTOMY LEVEL 2;  Surgeon: Heloise Purpura, MD;  Location: [facility];  Service: Urology;  Laterality: N/A;  210 MINUTES NEEDED FOR CASE   WRIST SURGERY Left    ganglion removed    SOCIAL HISTORY: Social History   Socioeconomic History   Marital status: Divorced    Spouse name: Not on file   Number of children: 2   Years of education: Not on file   Highest education level: Not on  file  Occupational History   Not on file  Tobacco Use   Smoking status: Former    Current packs/day: 0.00    Average packs/day: 1 pack/day for 0.5 years (0.5 ttl pk-yrs)    Types: Cigarettes    Start date: 05/1973    Quit date: 64    Years since quitting: 49.7   Smokeless tobacco: Not on file   Tobacco comments:    Smoked for 3 months only  Vaping Use   Vaping status: Never Used  Substance and Sexual Activity   Alcohol use: No   Drug use: Never   Sexual activity: Not Currently  Other Topics Concern   Not on file  Social History Narrative   Not on file   Social Determinants of Health   Financial Resource Strain: Not on file  Food Insecurity: No Food Insecurity (03/03/2023)   Hunger Vital Sign    Worried About Running Out of Food in the Last Year: Never true    Ran Out of Food in the Last Year: Never true  Transportation Needs: Unmet Transportation Needs (03/03/2023)   PRAPARE - Administrator, Civil Service (Medical): Yes    Lack of Transportation (Non-Medical): Yes  Physical Activity: Not on file  Stress: Not on file  Social Connections: Not on file  Intimate Partner Violence: Not At Risk (03/03/2023)   Humiliation, Afraid, Rape, and Kick questionnaire    Fear of Current or Ex-Partner: No    Emotionally Abused: No    Physically Abused: No    Sexually Abused: No    FAMILY HISTORY: Family History  Problem Relation Age of Onset   Anemia Mother    Stroke Father    Anemia Father    Throat cancer Father    Diabetes Brother    Ovarian cancer Paternal Aunt    Prostate cancer Paternal Uncle    Pancreatic cancer Paternal Uncle    Cancer Maternal Grandfather    Stroke Paternal Grandmother     ALLERGIES:  is allergic to duloxetine hcl, lisinopril, oxycodone hcl, pregabalin, tramadol hcl, and ampicillin.  MEDICATIONS:  Current Outpatient Medications  Medication Sig Dispense Refill   amLODipine (NORVASC) 5 MG tablet Take 5 mg by mouth every morning.      atorvastatin (LIPITOR) 40 MG tablet Take 40 mg by mouth at bedtime.     lisinopril (ZESTRIL) 10 MG tablet Take 10 mg by mouth daily.     omeprazole (PRILOSEC) 20 MG capsule Take 20 mg by mouth 2 (two) times daily.     polyethylene glycol (MIRALAX / GLYCOLAX) 17 g packet Take 17 g by mouth daily.     VENTOLIN HFA 108 (90 BASE) MCG/ACT inhaler Inhale 1-2 puffs into the lungs every 4 (four) hours as needed for shortness of breath. (Patient not taking: Reported on 06/26/2023)     No current facility-administered medications for this visit.    REVIEW OF SYSTEMS:   Constitutional: ( - ) fevers, ( - )  chills , ( - ) night sweats Eyes: ( - ) blurriness of vision, ( - )  double vision, ( - ) watery eyes Ears, nose, mouth, throat, and face: ( - ) mucositis, ( - ) sore throat Respiratory: ( - ) cough, ( - ) dyspnea, ( - ) wheezes Cardiovascular: ( - ) palpitation, ( - ) chest discomfort, ( - ) lower extremity swelling Gastrointestinal:  ( - ) nausea, ( - ) heartburn, ( - ) change in bowel habits Skin: ( - ) abnormal skin rashes Lymphatics: ( - ) new lymphadenopathy, ( - ) easy bruising Neurological: ( - ) numbness, ( - ) tingling, ( - ) new weaknesses Behavioral/Psych: ( - ) mood change, ( - ) new changes  All other systems were reviewed with the patient and are negative.  PHYSICAL EXAMINATION: ECOG PERFORMANCE STATUS: 1 - Symptomatic but completely ambulatory  Vitals:   08/13/23 1148  BP: 133/82  Pulse: 76  Resp: 16  Temp: 97.6 F (36.4 C)  SpO2: 96%    Filed Weights   08/13/23 1148  Weight: 232 lb 8 oz (105.5 kg)     GENERAL: well appearing elderly Caucasian male in no distress and comfortable SKIN: skin color, texture, turgor are normal, no rashes or significant lesions EYES: conjunctiva are pink and non-injected, sclera clear LUNGS: clear to auscultation and percussion with normal breathing effort HEART: regular rate & rhythm and no murmurs and no lower extremity  edema Musculoskeletal: no cyanosis of digits and no clubbing. Left foot no longer in large orthopedic boot.  PSYCH: alert & oriented x 3, fluent speech NEURO: no focal motor/sensory deficits  LABORATORY DATA:  I have reviewed the data as listed    Latest Ref Rng & Units 08/13/2023   10:43 AM 06/18/2023    9:56 AM 06/04/2023    9:34 AM  CBC  WBC 4.0 - 10.5 K/uL 6.3  7.4  6.4   Hemoglobin 13.0 - 17.0 g/dL 42.5  95.6  38.7   Hematocrit 39.0 - 52.0 % 41.6  42.5  43.5   Platelets 150 - 400 K/uL 217  204  305        Latest Ref Rng & Units 08/13/2023   10:43 AM 06/18/2023    9:56 AM 05/29/2023    9:58 AM  CMP  Glucose 70 - 99 mg/dL 564  332  951   BUN 8 - 23 mg/dL 12  9  12    Creatinine 0.61 - 1.24 mg/dL 8.84  1.66  0.63   Sodium 135 - 145 mmol/L 139  140  138   Potassium 3.5 - 5.1 mmol/L 3.8  3.8  4.2   Chloride 98 - 111 mmol/L 106  108  108   CO2 22 - 32 mmol/L 26  26  22    Calcium 8.9 - 10.3 mg/dL 9.3  9.4  9.3   Total Protein 6.5 - 8.1 g/dL 7.1  7.1  7.2   Total Bilirubin 0.3 - 1.2 mg/dL 0.9  0.9  0.6   Alkaline Phos 38 - 126 U/L 106  114  110   AST 15 - 41 U/L 24  22  39   ALT 0 - 44 U/L 29  30  44    Iron/TIBC/Ferritin/ %Sat    Component Value Date/Time   IRON 232 (H) 05/29/2023 0958   TIBC 244 (L) 05/29/2023 0958   FERRITIN 17 (L) 08/13/2023 1042   IRONPCTSAT 95 (H) 05/29/2023 0160     RADIOGRAPHIC STUDIES: None to review.  ASSESSMENT & PLAN Stuart Gaines 67 y.o. male with medical history  significant for hereditary hemachromatosis presents for a follow up visit.    Review of the labs and discussion with the patient his findings are most consistent with a hereditary hemochromatosis secondary to a homozygous C282Y mutation.  As such I do think it would be reasonable to continue moving forward with routine phlebotomies in order to decrease his ferritin level.  We did set an aggressive ferritin goal of ferritin less than 30 (after discussion we decided to take it from  50 down further to 30), but on 11/28/2021 the patient noted he would be agreeable to the standard threshold of 50.  We will try to assure that we do not rapidly drop his hemoglobin to cause him to have any symptoms of fatigue, lightheadedness or dizziness.  On exam today Stuart Gaines is at his baseline level of health.  His ferritin was at goal at last check 3 months ago, rechecking levels today.  He established care with Dr. Barbaraann Cao who is helping to manage his headaches which is thought to be secondary to an excess CSF.  We'll plan to see Stuart Gaines back in 6 months time unless he requires further phlebotomy.   #Hereditary Hemachromatosis (Homozygous C282Y) --repeat CBC and ferritin levels today --plan to restart phlebotomy q 2 weeks if ferritin is found to be >150. Patient has completed 9 phlebotomies and has been on hold since reaching goal on 03/02/2021.  -- Echocardiogram on 12/15/2019 shows good baseline cardiac function PLAN: --labs today show white blood cell 6.3, hemoglobin 15.1, MCV 90.4, and platelets of 217.  Ferritin was 17. --Ferritin currently on target, no need for further phlebotomies until ferritin is greater than 150. -- RTC in 6 months with labs 1 week before.  # Localized Adenocarcinoma of the Prostate s/p Prostatectomy on 03/03/2023 -- We will be available if there is anything we do to help manage the results of his prostate biopsy. -- [facility].  # Headaches - evaluated by neurology, continue care with Dr. Barbaraann Cao as needed  --headaches improved, occur infrequently  No orders of the defined types were placed in this encounter.   All questions were answered. The patient knows to call the clinic with any problems, questions or concerns.  A total of more than 30 minutes were spent on this encounter and over half of that time was spent on counseling and coordination of care as outlined above.   Ulysees Barns,  MD Department of Hematology/Oncology [facility] Phone: [phone] Pager: [phone] Email: [email or web address]  08/14/2023 9:07 AM

## 2023-08-14 ENCOUNTER — Encounter: Payer: Self-pay | Admitting: Hematology and Oncology

## 2023-09-10 DIAGNOSIS — M6281 Muscle weakness (generalized): Secondary | ICD-10-CM | POA: Diagnosis not present

## 2023-09-10 DIAGNOSIS — M62838 Other muscle spasm: Secondary | ICD-10-CM | POA: Diagnosis not present

## 2023-09-18 DIAGNOSIS — C44229 Squamous cell carcinoma of skin of left ear and external auricular canal: Secondary | ICD-10-CM | POA: Diagnosis not present

## 2023-09-18 DIAGNOSIS — L821 Other seborrheic keratosis: Secondary | ICD-10-CM | POA: Diagnosis not present

## 2023-09-18 DIAGNOSIS — D485 Neoplasm of uncertain behavior of skin: Secondary | ICD-10-CM | POA: Diagnosis not present

## 2023-09-18 DIAGNOSIS — D0339 Melanoma in situ of other parts of face: Secondary | ICD-10-CM | POA: Diagnosis not present

## 2023-09-23 DIAGNOSIS — D0330 Melanoma in situ of unspecified part of face: Secondary | ICD-10-CM | POA: Diagnosis not present

## 2023-09-23 DIAGNOSIS — K219 Gastro-esophageal reflux disease without esophagitis: Secondary | ICD-10-CM | POA: Diagnosis not present

## 2023-09-23 DIAGNOSIS — I10 Essential (primary) hypertension: Secondary | ICD-10-CM | POA: Diagnosis not present

## 2023-09-23 DIAGNOSIS — E7800 Pure hypercholesterolemia, unspecified: Secondary | ICD-10-CM | POA: Diagnosis not present

## 2023-09-23 DIAGNOSIS — Z23 Encounter for immunization: Secondary | ICD-10-CM | POA: Diagnosis not present

## 2023-09-23 DIAGNOSIS — G894 Chronic pain syndrome: Secondary | ICD-10-CM | POA: Diagnosis not present

## 2023-10-01 DIAGNOSIS — D0339 Melanoma in situ of other parts of face: Secondary | ICD-10-CM | POA: Diagnosis not present

## 2023-10-01 DIAGNOSIS — C4339 Malignant melanoma of other parts of face: Secondary | ICD-10-CM | POA: Diagnosis not present

## 2023-10-23 DIAGNOSIS — C44229 Squamous cell carcinoma of skin of left ear and external auricular canal: Secondary | ICD-10-CM | POA: Diagnosis not present

## 2023-11-13 DIAGNOSIS — H2513 Age-related nuclear cataract, bilateral: Secondary | ICD-10-CM | POA: Diagnosis not present

## 2023-11-18 DIAGNOSIS — Z85828 Personal history of other malignant neoplasm of skin: Secondary | ICD-10-CM | POA: Diagnosis not present

## 2023-11-18 DIAGNOSIS — L821 Other seborrheic keratosis: Secondary | ICD-10-CM | POA: Diagnosis not present

## 2023-11-18 DIAGNOSIS — D225 Melanocytic nevi of trunk: Secondary | ICD-10-CM | POA: Diagnosis not present

## 2023-11-18 DIAGNOSIS — Z7189 Other specified counseling: Secondary | ICD-10-CM | POA: Diagnosis not present

## 2023-11-18 DIAGNOSIS — Z86006 Personal history of melanoma in-situ: Secondary | ICD-10-CM | POA: Diagnosis not present

## 2023-11-18 DIAGNOSIS — L814 Other melanin hyperpigmentation: Secondary | ICD-10-CM | POA: Diagnosis not present

## 2023-11-18 DIAGNOSIS — Z08 Encounter for follow-up examination after completed treatment for malignant neoplasm: Secondary | ICD-10-CM | POA: Diagnosis not present

## 2023-11-18 DIAGNOSIS — D1801 Hemangioma of skin and subcutaneous tissue: Secondary | ICD-10-CM | POA: Diagnosis not present

## 2023-11-27 ENCOUNTER — Ambulatory Visit: Payer: Medicare Other | Admitting: Hematology and Oncology

## 2023-11-27 ENCOUNTER — Inpatient Hospital Stay: Payer: Medicare Other | Attending: Hematology and Oncology

## 2023-11-27 DIAGNOSIS — Z87891 Personal history of nicotine dependence: Secondary | ICD-10-CM | POA: Insufficient documentation

## 2023-11-27 LAB — CMP (CANCER CENTER ONLY)
ALT: 29 U/L (ref 0–44)
AST: 26 U/L (ref 15–41)
Albumin: 4.5 g/dL (ref 3.5–5.0)
Alkaline Phosphatase: 119 U/L (ref 38–126)
Anion gap: 6 (ref 5–15)
BUN: 11 mg/dL (ref 8–23)
CO2: 30 mmol/L (ref 22–32)
Calcium: 9.9 mg/dL (ref 8.9–10.3)
Chloride: 105 mmol/L (ref 98–111)
Creatinine: 1.03 mg/dL (ref 0.61–1.24)
GFR, Estimated: >60 mL/min (ref >60)
Glucose, Bld: 125 mg/dL — ABNORMAL HIGH (ref 70–99)
Potassium: 4.8 mmol/L (ref 3.5–5.1)
Sodium: 141 mmol/L (ref 135–145)
Total Bilirubin: 1.2 mg/dL (ref 0.0–1.2)
Total Protein: 7.5 g/dL (ref 6.5–8.1)

## 2023-11-27 LAB — CBC WITH DIFFERENTIAL (CANCER CENTER ONLY)
Abs Immature Granulocytes: 0.01 10*3/uL (ref 0.00–0.07)
Basophils Absolute: 0.1 10*3/uL (ref 0.0–0.1)
Basophils Relative: 1 %
Eosinophils Absolute: 0.1 10*3/uL (ref 0.0–0.5)
Eosinophils Relative: 2 %
HCT: 45.6 % (ref 39.0–52.0)
Hemoglobin: 16.1 g/dL (ref 13.0–17.0)
Immature Granulocytes: 0 %
Lymphocytes Relative: 51 %
Lymphs Abs: 4 10*3/uL (ref 0.7–4.0)
MCH: 31.3 pg (ref 26.0–34.0)
MCHC: 35.3 g/dL (ref 30.0–36.0)
MCV: 88.7 fL (ref 80.0–100.0)
Monocytes Absolute: 0.5 10*3/uL (ref 0.1–1.0)
Monocytes Relative: 7 %
Neutro Abs: 3.1 10*3/uL (ref 1.7–7.7)
Neutrophils Relative %: 39 %
Platelet Count: 234 10*3/uL (ref 150–400)
RBC: 5.14 MIL/uL (ref 4.22–5.81)
RDW: 13 % (ref 11.5–15.5)
WBC Count: 7.8 10*3/uL (ref 4.0–10.5)
nRBC: 0 % (ref 0.0–0.2)

## 2023-11-27 LAB — FERRITIN: Ferritin: 16 ng/mL — ABNORMAL LOW (ref 24–336)

## 2024-02-16 DIAGNOSIS — D225 Melanocytic nevi of trunk: Secondary | ICD-10-CM | POA: Diagnosis not present

## 2024-02-16 DIAGNOSIS — L821 Other seborrheic keratosis: Secondary | ICD-10-CM | POA: Diagnosis not present

## 2024-02-16 DIAGNOSIS — Z85828 Personal history of other malignant neoplasm of skin: Secondary | ICD-10-CM | POA: Diagnosis not present

## 2024-02-16 DIAGNOSIS — Z7189 Other specified counseling: Secondary | ICD-10-CM | POA: Diagnosis not present

## 2024-02-16 DIAGNOSIS — Z86006 Personal history of melanoma in-situ: Secondary | ICD-10-CM | POA: Diagnosis not present

## 2024-02-16 DIAGNOSIS — Z08 Encounter for follow-up examination after completed treatment for malignant neoplasm: Secondary | ICD-10-CM | POA: Diagnosis not present

## 2024-02-16 DIAGNOSIS — L814 Other melanin hyperpigmentation: Secondary | ICD-10-CM | POA: Diagnosis not present

## 2024-02-16 DIAGNOSIS — L570 Actinic keratosis: Secondary | ICD-10-CM | POA: Diagnosis not present

## 2024-03-01 DIAGNOSIS — J209 Acute bronchitis, unspecified: Secondary | ICD-10-CM | POA: Diagnosis not present

## 2024-04-05 ENCOUNTER — Encounter: Payer: Self-pay | Admitting: Hematology and Oncology

## 2024-04-07 ENCOUNTER — Telehealth: Payer: Self-pay | Admitting: Hematology and Oncology

## 2024-04-14 ENCOUNTER — Inpatient Hospital Stay: Attending: Hematology and Oncology

## 2024-04-14 LAB — CBC WITH DIFFERENTIAL (CANCER CENTER ONLY)
Abs Immature Granulocytes: 0.01 10*3/uL (ref 0.00–0.07)
Basophils Absolute: 0.1 10*3/uL (ref 0.0–0.1)
Basophils Relative: 1 %
Eosinophils Absolute: 0.1 10*3/uL (ref 0.0–0.5)
Eosinophils Relative: 1 %
HCT: 42.6 % (ref 39.0–52.0)
Hemoglobin: 15.4 g/dL (ref 13.0–17.0)
Immature Granulocytes: 0 %
Lymphocytes Relative: 43 %
Lymphs Abs: 2.5 10*3/uL (ref 0.7–4.0)
MCH: 31.7 pg (ref 26.0–34.0)
MCHC: 36.2 g/dL — ABNORMAL HIGH (ref 30.0–36.0)
MCV: 87.7 fL (ref 80.0–100.0)
Monocytes Absolute: 0.4 10*3/uL (ref 0.1–1.0)
Monocytes Relative: 6 %
Neutro Abs: 2.8 10*3/uL (ref 1.7–7.7)
Neutrophils Relative %: 49 %
Platelet Count: 208 10*3/uL (ref 150–400)
RBC: 4.86 MIL/uL (ref 4.22–5.81)
RDW: 12.8 % (ref 11.5–15.5)
WBC Count: 5.8 10*3/uL (ref 4.0–10.5)
nRBC: 0 % (ref 0.0–0.2)

## 2024-04-14 LAB — CMP (CANCER CENTER ONLY)
ALT: 31 U/L (ref 0–44)
AST: 28 U/L (ref 15–41)
Albumin: 4.1 g/dL (ref 3.5–5.0)
Alkaline Phosphatase: 101 U/L (ref 38–126)
Anion gap: 10 (ref 5–15)
BUN: 12 mg/dL (ref 8–23)
CO2: 23 mmol/L (ref 22–32)
Calcium: 9.1 mg/dL (ref 8.9–10.3)
Chloride: 104 mmol/L (ref 98–111)
Creatinine: 1.02 mg/dL (ref 0.61–1.24)
GFR, Estimated: >60 mL/min (ref >60)
Glucose, Bld: 124 mg/dL — ABNORMAL HIGH (ref 70–99)
Potassium: 3.8 mmol/L (ref 3.5–5.1)
Sodium: 137 mmol/L (ref 135–145)
Total Bilirubin: 1.1 mg/dL (ref 0.0–1.2)
Total Protein: 7.1 g/dL (ref 6.5–8.1)

## 2024-04-14 LAB — FERRITIN: Ferritin: 22 ng/mL — ABNORMAL LOW (ref 24–336)

## 2024-04-20 DIAGNOSIS — E7800 Pure hypercholesterolemia, unspecified: Secondary | ICD-10-CM | POA: Diagnosis not present

## 2024-04-20 DIAGNOSIS — J4520 Mild intermittent asthma, uncomplicated: Secondary | ICD-10-CM | POA: Diagnosis not present

## 2024-04-20 DIAGNOSIS — Z08 Encounter for follow-up examination after completed treatment for malignant neoplasm: Secondary | ICD-10-CM | POA: Diagnosis not present

## 2024-04-20 DIAGNOSIS — Z0000 Encounter for general adult medical examination without abnormal findings: Secondary | ICD-10-CM | POA: Diagnosis not present

## 2024-04-20 DIAGNOSIS — G894 Chronic pain syndrome: Secondary | ICD-10-CM | POA: Diagnosis not present

## 2024-04-20 DIAGNOSIS — I10 Essential (primary) hypertension: Secondary | ICD-10-CM | POA: Diagnosis not present

## 2024-04-20 DIAGNOSIS — K219 Gastro-esophageal reflux disease without esophagitis: Secondary | ICD-10-CM | POA: Diagnosis not present

## 2024-04-20 DIAGNOSIS — Z860100 Personal history of colon polyps, unspecified: Secondary | ICD-10-CM | POA: Diagnosis not present

## 2024-04-20 DIAGNOSIS — M549 Dorsalgia, unspecified: Secondary | ICD-10-CM | POA: Diagnosis not present

## 2024-04-20 DIAGNOSIS — Z85820 Personal history of malignant melanoma of skin: Secondary | ICD-10-CM | POA: Diagnosis not present

## 2024-04-20 NOTE — Progress Notes (Signed)
 [facility] Telephone:(336) [phone]   Fax:(336) [phone]  PROGRESS NOTE  Patient Care Team: Karmen Pa, MD as PCP - General Katheleen Palmer, RN as Oncology Nurse Navigator Florencio Hunting, MD as Consulting Physician (Urology) Kenith Payer, MD as Consulting Physician (Radiation Oncology) Neda Balk, RN as Registered Nurse  Hematological/Oncological History # Hereditary Hemachromatosis (Homozygous C282Y) 1) 09/13/2019: Transferrin 189, Ferritin 117.4. PCP referred to hematology for further evaluation and management.  2) 10/05/2019: establish care with Dr. Rosaline Coma  3) 10/26/2019: Phlebotomy #1 4) 12/10/2019: Phlebotomy #2. Ferritin 249 (12/06/19)  5) 12/23/2019: Phlebotomy #3. Ferritin 145 6) 01/14/2020: Phlebotomy #4. Ferritin 117 7) 01/27/2020: Phlebotomy #5. Ferritin 88 8) 02/10/2020: Phlebotomy #6 and f/u visit. Ferritin 36.  9) 03/09/2020: Phlebotomy #7. Ferritin 22, held further phlebotomy.  10) 02/02/2021: Phlebotomy #8. Ferritin 56 11) 02/16/2021:Phlebotomy #9. Ferritin 52 12) 03/02/2021: Phlebotomy #10. Ferritin 25, held further phlebotomy.  Interval History:  Stuart Gaines 68 y.o. male with medical history significant for hereditary hemachromatosis presents for a follow up visit. The patient's last visit was on 08/13/2023. In the interim since the last visit he has had no major changes in his health.  On exam today today Stuart Gaines reports he has been well overall interim since her last visit.  He is no longer wearing the boot on his foot.  He reports he has had no ER visits or hospitalizations.  He did have 2 skin lesions removed from his skin.  He reports these were "dry spots" and removed by dermatology.  He reports his energy and appetite are quite good.  He notes he did have a viral infection a few weeks ago with cough that lasted for 7 days.  There is also some stomach upset.  He was not tested for flu or COVID but did receive a Z-Pak.  He notes that he did lose  his taste for a period of time.  He otherwise denies any current fevers, chills, sweats, nausea, vomiting or diarrhea.  A full 10 point ROS is otherwise negative.  MEDICAL HISTORY:  Past Medical History:  Diagnosis Date   Arthritis    Asthma    seasonal   Blood dyscrasia    hemachromatosis   Cancer (HCC)    prostate   Chronic back pain    Complication of anesthesia    very agitated and combative on both back surgeries   High cholesterol    Hypertension     SURGICAL HISTORY: Past Surgical History:  Procedure Laterality Date   BACK SURGERY     1990 and 1999  by Dr. Rica Chalet   CHOLECYSTECTOMY     FINGER SURGERY Right    right thumb cyst removal   FOOT ARTHRODESIS Right 01/02/2023   Procedure: Right First, Second and Third Tarsal Metatarsal Joint Arthrodesis;  Surgeon: Amada Backer, MD;  Location: [facility];  Service: Orthopedics;  Laterality: Right;   FOOT FRACTURE SURGERY Right    FRACTURE SURGERY Right 2015   ORIF   GASTROCNEMIUS RECESSION Right 01/02/2023   Procedure: Gastrocnemius Recession;  Surgeon: Amada Backer, MD;  Location: [facility];  Service: Orthopedics;  Laterality: Right;   HEMORROIDECTOMY  2006   LYMPHADENECTOMY Bilateral 03/03/2023   Procedure: BILATERAL PELVIC LYMPHADENECTOMY;  Surgeon: Florencio Hunting, MD;  Location: [facility];  Service: Urology;  Laterality: Bilateral;   right knee surgery Bilateral    meniscus   ROBOT ASSISTED LAPAROSCOPIC RADICAL PROSTATECTOMY N/A 03/03/2023   Procedure: XI ROBOTIC ASSISTED LAPAROSCOPIC  RADICAL PROSTATECTOMY LEVEL 2;  Surgeon: Florencio Hunting, MD;  Location: [facility];  Service: Urology;  Laterality: N/A;  210 MINUTES NEEDED FOR CASE   WRIST SURGERY Left    ganglion removed    SOCIAL HISTORY: Social History   Socioeconomic History   Marital status: Divorced    Spouse name: Not on file   Number of children: 2   Years of education: Not on file   Highest education level: Not on file   Occupational History   Not on file  Tobacco Use   Smoking status: Former    Current packs/day: 0.00    Average packs/day: 1 pack/day for 0.5 years (0.5 ttl pk-yrs)    Types: Cigarettes    Start date: 05/1973    Quit date: 54    Years since quitting: 50.4   Smokeless tobacco: Not on file   Tobacco comments:    Smoked for 3 months only  Vaping Use   Vaping status: Never Used  Substance and Sexual Activity   Alcohol  use: No   Drug use: Never   Sexual activity: Not Currently  Other Topics Concern   Not on file  Social History Narrative   Not on file   Social Drivers of Health   Financial Resource Strain: Not on file  Food Insecurity: No Food Insecurity (03/03/2023)   Hunger Vital Sign    Worried About Running Out of Food in the Last Year: Never true    Ran Out of Food in the Last Year: Never true  Transportation Needs: Unmet Transportation Needs (03/03/2023)   PRAPARE - Administrator, Civil Service (Medical): Yes    Lack of Transportation (Non-Medical): Yes  Physical Activity: Not on file  Stress: Not on file  Social Connections: Not on file  Intimate Partner Violence: Not At Risk (03/03/2023)   Humiliation, Afraid, Rape, and Kick questionnaire    Fear of Current or Ex-Partner: No    Emotionally Abused: No    Physically Abused: No    Sexually Abused: No    FAMILY HISTORY: Family History  Problem Relation Age of Onset   Anemia Mother    Stroke Father    Anemia Father    Throat cancer Father    Diabetes Brother    Ovarian cancer Paternal Aunt    Prostate cancer Paternal Uncle    Pancreatic cancer Paternal Uncle    Cancer Maternal Grandfather    Stroke Paternal Grandmother     ALLERGIES:  is allergic to duloxetine hcl, lisinopril , oxycodone hcl, pregabalin, tramadol hcl, and ampicillin.  MEDICATIONS:  Current Outpatient Medications  Medication Sig Dispense Refill   amLODipine  (NORVASC ) 5 MG tablet Take 5 mg by mouth every morning.      atorvastatin  (LIPITOR) 40 MG tablet Take 40 mg by mouth at bedtime.     lisinopril  (ZESTRIL ) 10 MG tablet Take 10 mg by mouth daily.     omeprazole (PRILOSEC) 20 MG capsule Take 20 mg by mouth 2 (two) times daily.     polyethylene glycol (MIRALAX / GLYCOLAX) 17 g packet Take 17 g by mouth daily.     VENTOLIN  HFA 108 (90 BASE) MCG/ACT inhaler Inhale 1-2 puffs into the lungs every 4 (four) hours as needed for shortness of breath. (Patient not taking: Reported on 06/26/2023)     No current facility-administered medications for this visit.    REVIEW OF SYSTEMS:   Constitutional: ( - ) fevers, ( - )  chills , ( - ) night  sweats Eyes: ( - ) blurriness of vision, ( - ) double vision, ( - ) watery eyes Ears, nose, mouth, throat, and face: ( - ) mucositis, ( - ) sore throat Respiratory: ( - ) cough, ( - ) dyspnea, ( - ) wheezes Cardiovascular: ( - ) palpitation, ( - ) chest discomfort, ( - ) lower extremity swelling Gastrointestinal:  ( - ) nausea, ( - ) heartburn, ( - ) change in bowel habits Skin: ( - ) abnormal skin rashes Lymphatics: ( - ) new lymphadenopathy, ( - ) easy bruising Neurological: ( - ) numbness, ( - ) tingling, ( - ) new weaknesses Behavioral/Psych: ( - ) mood change, ( - ) new changes  All other systems were reviewed with the patient and are negative.  PHYSICAL EXAMINATION: ECOG PERFORMANCE STATUS: 1 - Symptomatic but completely ambulatory  Vitals:   04/21/24 0905  BP: 138/81  Pulse: 83  Resp: 15  Temp: 98.4 F (36.9 C)  SpO2: 98%     Filed Weights   04/21/24 0905  Weight: 234 lb 8 oz (106.4 kg)      GENERAL: well appearing elderly Caucasian male in no distress and comfortable SKIN: skin color, texture, turgor are normal, no rashes or significant lesions EYES: conjunctiva are pink and non-injected, sclera clear LUNGS: clear to auscultation and percussion with normal breathing effort HEART: regular rate & rhythm and no murmurs and no lower extremity  edema Musculoskeletal: no cyanosis of digits and no clubbing. Left foot no longer in large orthopedic boot.  PSYCH: alert & oriented x 3, fluent speech NEURO: no focal motor/sensory deficits  LABORATORY DATA:  I have reviewed the data as listed    Latest Ref Rng & Units 04/14/2024    9:44 AM 11/27/2023    9:34 AM 08/13/2023   10:43 AM  CBC  WBC 4.0 - 10.5 K/uL 5.8  7.8  6.3   Hemoglobin 13.0 - 17.0 g/dL 40.9  81.1  91.4   Hematocrit 39.0 - 52.0 % 42.6  45.6  41.6   Platelets 150 - 400 K/uL 208  234  217        Latest Ref Rng & Units 04/14/2024    9:44 AM 11/27/2023    9:34 AM 08/13/2023   10:43 AM  CMP  Glucose 70 - 99 mg/dL 782  956  213   BUN 8 - 23 mg/dL 12  11  12    Creatinine 0.61 - 1.24 mg/dL 0.86  5.78  4.69   Sodium 135 - 145 mmol/L 137  141  139   Potassium 3.5 - 5.1 mmol/L 3.8  4.8  3.8   Chloride 98 - 111 mmol/L 104  105  106   CO2 22 - 32 mmol/L 23  30  26    Calcium  8.9 - 10.3 mg/dL 9.1  9.9  9.3   Total Protein 6.5 - 8.1 g/dL 7.1  7.5  7.1   Total Bilirubin 0.0 - 1.2 mg/dL 1.1  1.2  0.9   Alkaline Phos 38 - 126 U/L 101  119  106   AST 15 - 41 U/L 28  26  24    ALT 0 - 44 U/L 31  29  29     Iron/TIBC/Ferritin/ %Sat    Component Value Date/Time   IRON 232 (H) 05/29/2023 0958   TIBC 244 (L) 05/29/2023 0958   FERRITIN 22 (L) 04/14/2024 0944   IRONPCTSAT 95 (H) 05/29/2023 6295     RADIOGRAPHIC STUDIES: None to review.  ASSESSMENT & PLAN HERSON PRICHARD 68 y.o. male with medical history significant for hereditary hemachromatosis presents for a follow up visit.    Review of the labs and discussion with the patient his findings are most consistent with a hereditary hemochromatosis secondary to a homozygous C282Y mutation.  As such I do think it would be reasonable to continue moving forward with routine phlebotomies in order to decrease his ferritin level.  We did set an aggressive ferritin goal of ferritin less than 30 (after discussion we decided to take it from  50 down further to 30), but on 11/28/2021 the patient noted he would be agreeable to the standard threshold of 50.  We will try to assure that we do not rapidly drop his hemoglobin to cause him to have any symptoms of fatigue, lightheadedness or dizziness.  On exam today Stuart Gaines is at his baseline level of health.  His ferritin was at goal at last check 3 months ago, rechecking levels today.  He established care with Dr. Mark Sil who is helping to manage his headaches which is thought to be secondary to an excess CSF.  We'll plan to see Stuart Gaines back in 6 months time unless he requires further phlebotomy.   #Hereditary Hemachromatosis (Homozygous C282Y) --repeat CBC and ferritin levels today --plan to restart phlebotomy q 2 weeks if ferritin is found to be >150. Patient has completed 9 phlebotomies and has been on hold since reaching goal on 03/02/2021.  -- Echocardiogram on 12/15/2019 shows good baseline cardiac function PLAN: --labs today show white blood cell 5.8, hemoglobin 15.4, MCV 87.7, platelets 208.  Ferritin was 22 --Ferritin currently on target, no need for further phlebotomies until ferritin is greater than 150. -- RTC in 6 months for labs and 12 months for clinic visit with labs 1 week before.  # Localized Adenocarcinoma of the Prostate s/p Prostatectomy on 03/03/2023 -- We will be available if there is anything we do to help manage the results of his prostate biopsy.  Medically done -- [facility] post [facility].  # Headaches - evaluated by neurology, continue care with Dr. Mark Sil as needed  --headaches improved, occur infrequently  No orders of the defined types were placed in this encounter.   All questions were answered. The patient knows to call the clinic with any problems, questions or concerns.  A total of more than 30 minutes were spent on this encounter and over half of that time was spent on counseling and coordination  of care as outlined above.   Stuart Clay, MD Department of Hematology/Oncology [facility] Phone: [phone] Pager: [phone] Email: [email or web address]  04/25/2024 1:03 PM

## 2024-04-21 ENCOUNTER — Inpatient Hospital Stay: Attending: Hematology and Oncology | Admitting: Hematology and Oncology

## 2024-04-21 DIAGNOSIS — Z9079 Acquired absence of other genital organ(s): Secondary | ICD-10-CM | POA: Insufficient documentation

## 2024-04-21 DIAGNOSIS — Z87891 Personal history of nicotine dependence: Secondary | ICD-10-CM | POA: Insufficient documentation

## 2024-04-25 ENCOUNTER — Encounter: Payer: Self-pay | Admitting: Hematology and Oncology

## 2024-06-22 DIAGNOSIS — K5900 Constipation, unspecified: Secondary | ICD-10-CM | POA: Diagnosis not present

## 2024-06-22 DIAGNOSIS — K219 Gastro-esophageal reflux disease without esophagitis: Secondary | ICD-10-CM | POA: Diagnosis not present

## 2024-06-22 DIAGNOSIS — Z86018 Personal history of other benign neoplasm: Secondary | ICD-10-CM | POA: Diagnosis not present

## 2024-06-23 DIAGNOSIS — D225 Melanocytic nevi of trunk: Secondary | ICD-10-CM | POA: Diagnosis not present

## 2024-06-23 DIAGNOSIS — Z86006 Personal history of melanoma in-situ: Secondary | ICD-10-CM | POA: Diagnosis not present

## 2024-06-23 DIAGNOSIS — Z85820 Personal history of malignant melanoma of skin: Secondary | ICD-10-CM | POA: Diagnosis not present

## 2024-06-23 DIAGNOSIS — L814 Other melanin hyperpigmentation: Secondary | ICD-10-CM | POA: Diagnosis not present

## 2024-06-23 DIAGNOSIS — Z85828 Personal history of other malignant neoplasm of skin: Secondary | ICD-10-CM | POA: Diagnosis not present

## 2024-06-23 DIAGNOSIS — L821 Other seborrheic keratosis: Secondary | ICD-10-CM | POA: Diagnosis not present

## 2024-06-23 DIAGNOSIS — Z08 Encounter for follow-up examination after completed treatment for malignant neoplasm: Secondary | ICD-10-CM | POA: Diagnosis not present

## 2024-06-23 DIAGNOSIS — L570 Actinic keratosis: Secondary | ICD-10-CM | POA: Diagnosis not present

## 2024-07-09 DIAGNOSIS — K648 Other hemorrhoids: Secondary | ICD-10-CM | POA: Diagnosis not present

## 2024-07-09 DIAGNOSIS — Z1211 Encounter for screening for malignant neoplasm of colon: Secondary | ICD-10-CM | POA: Diagnosis not present

## 2024-07-09 DIAGNOSIS — K5730 Diverticulosis of large intestine without perforation or abscess without bleeding: Secondary | ICD-10-CM | POA: Diagnosis not present

## 2024-07-09 DIAGNOSIS — D125 Benign neoplasm of sigmoid colon: Secondary | ICD-10-CM | POA: Diagnosis not present

## 2024-07-09 DIAGNOSIS — K644 Residual hemorrhoidal skin tags: Secondary | ICD-10-CM | POA: Diagnosis not present

## 2024-07-09 DIAGNOSIS — D123 Benign neoplasm of transverse colon: Secondary | ICD-10-CM | POA: Diagnosis not present

## 2024-10-27 ENCOUNTER — Inpatient Hospital Stay: Attending: Hematology and Oncology

## 2024-10-27 ENCOUNTER — Other Ambulatory Visit: Payer: Self-pay | Admitting: Hematology and Oncology

## 2024-10-27 LAB — IRON AND IRON BINDING CAPACITY (CC-WL,HP ONLY)
Iron: 133 ug/dL (ref 45–182)
Saturation Ratios: 42 % — ABNORMAL HIGH (ref 17.9–39.5)
TIBC: 318 ug/dL (ref 250–450)
UIBC: 185 ug/dL

## 2024-10-27 LAB — CMP (CANCER CENTER ONLY)
ALT: 26 U/L (ref 0–44)
AST: 27 U/L (ref 15–41)
Albumin: 4.5 g/dL (ref 3.5–5.0)
Alkaline Phosphatase: 126 U/L (ref 38–126)
Anion gap: 12 (ref 5–15)
BUN: 10 mg/dL (ref 8–23)
CO2: 24 mmol/L (ref 22–32)
Calcium: 9.4 mg/dL (ref 8.9–10.3)
Chloride: 102 mmol/L (ref 98–111)
Creatinine: 0.95 mg/dL (ref 0.61–1.24)
GFR, Estimated: >60 mL/min (ref >60)
Glucose, Bld: 117 mg/dL — ABNORMAL HIGH (ref 70–99)
Potassium: 3.7 mmol/L (ref 3.5–5.1)
Sodium: 138 mmol/L (ref 135–145)
Total Bilirubin: 1 mg/dL (ref 0.0–1.2)
Total Protein: 7.4 g/dL (ref 6.5–8.1)

## 2024-10-27 LAB — CBC WITH DIFFERENTIAL (CANCER CENTER ONLY)
Abs Immature Granulocytes: 0.01 K/uL (ref 0.00–0.07)
Basophils Absolute: 0.1 K/uL (ref 0.0–0.1)
Basophils Relative: 1 %
Eosinophils Absolute: 0.1 K/uL (ref 0.0–0.5)
Eosinophils Relative: 2 %
HCT: 43.9 % (ref 39.0–52.0)
Hemoglobin: 15.9 g/dL (ref 13.0–17.0)
Immature Granulocytes: 0 %
Lymphocytes Relative: 43 %
Lymphs Abs: 2.7 K/uL (ref 0.7–4.0)
MCH: 31.7 pg (ref 26.0–34.0)
MCHC: 36.2 g/dL — ABNORMAL HIGH (ref 30.0–36.0)
MCV: 87.5 fL (ref 80.0–100.0)
Monocytes Absolute: 0.4 K/uL (ref 0.1–1.0)
Monocytes Relative: 7 %
Neutro Abs: 3 K/uL (ref 1.7–7.7)
Neutrophils Relative %: 47 %
Platelet Count: 229 K/uL (ref 150–400)
RBC: 5.02 MIL/uL (ref 4.22–5.81)
RDW: 12.2 % (ref 11.5–15.5)
WBC Count: 6.3 K/uL (ref 4.0–10.5)
nRBC: 0 % (ref 0.0–0.2)

## 2024-10-27 LAB — FERRITIN: Ferritin: 31 ng/mL (ref 24–336)

## 2024-10-27 LAB — LACTATE DEHYDROGENASE: LDH: 185 U/L (ref 105–235)

## 2025-04-27 ENCOUNTER — Other Ambulatory Visit

## 2025-05-04 ENCOUNTER — Ambulatory Visit: Admitting: Hematology and Oncology
